# Patient Record
Sex: Female | Born: 1950 | Race: Black or African American | Hispanic: No
Health system: Midwestern US, Community
[De-identification: ages and names within clinical notes are randomized; demographics above are authoritative.]

## PROBLEM LIST (undated history)

## (undated) DIAGNOSIS — R739 Hyperglycemia, unspecified: Secondary | ICD-10-CM

## (undated) DIAGNOSIS — M5430 Sciatica, unspecified side: Secondary | ICD-10-CM

## (undated) DIAGNOSIS — F609 Personality disorder, unspecified: Secondary | ICD-10-CM

## (undated) DIAGNOSIS — G894 Chronic pain syndrome: Secondary | ICD-10-CM

## (undated) DIAGNOSIS — K589 Irritable bowel syndrome without diarrhea: Secondary | ICD-10-CM

## (undated) DIAGNOSIS — F418 Other specified anxiety disorders: Secondary | ICD-10-CM

## (undated) DIAGNOSIS — M109 Gout, unspecified: Secondary | ICD-10-CM

## (undated) DIAGNOSIS — I10 Essential (primary) hypertension: Secondary | ICD-10-CM

## (undated) DIAGNOSIS — E559 Vitamin D deficiency, unspecified: Secondary | ICD-10-CM

## (undated) DIAGNOSIS — G4700 Insomnia, unspecified: Secondary | ICD-10-CM

## (undated) DIAGNOSIS — N2889 Other specified disorders of kidney and ureter: Secondary | ICD-10-CM

## (undated) DIAGNOSIS — K219 Gastro-esophageal reflux disease without esophagitis: Secondary | ICD-10-CM

## (undated) DIAGNOSIS — M1990 Unspecified osteoarthritis, unspecified site: Secondary | ICD-10-CM

## (undated) HISTORY — PX: BREAST LUMPECTOMY: SHX2

## (undated) HISTORY — PX: ABDOMINAL HYSTERECTOMY: SHX81

## (undated) HISTORY — PX: SHOULDER SURGERY: SHX246

---

## 2007-03-02 ENCOUNTER — Ambulatory Visit: Payer: Self-pay | Admitting: Family Medicine

## 2007-03-25 ENCOUNTER — Ambulatory Visit: Payer: Self-pay | Admitting: Family Medicine

## 2011-03-01 ENCOUNTER — Emergency Department (HOSPITAL_COMMUNITY)
Admission: EM | Admit: 2011-03-01 | Discharge: 2011-03-01 | Disposition: A | Discharge status: Home / Self Care | Payer: Medicare Other | Attending: Emergency Medicine | Admitting: Emergency Medicine

## 2011-03-01 DIAGNOSIS — Z862 Personal history of diseases of the blood and blood-forming organs and certain disorders involving the immune mechanism: Secondary | ICD-10-CM | POA: Insufficient documentation

## 2011-03-01 DIAGNOSIS — E876 Hypokalemia: Secondary | ICD-10-CM | POA: Insufficient documentation

## 2011-03-01 DIAGNOSIS — N390 Urinary tract infection, site not specified: Secondary | ICD-10-CM | POA: Insufficient documentation

## 2011-03-01 DIAGNOSIS — Z8639 Personal history of other endocrine, nutritional and metabolic disease: Secondary | ICD-10-CM | POA: Insufficient documentation

## 2011-03-01 DIAGNOSIS — R1031 Right lower quadrant pain: Secondary | ICD-10-CM | POA: Insufficient documentation

## 2011-03-01 DIAGNOSIS — I10 Essential (primary) hypertension: Secondary | ICD-10-CM | POA: Insufficient documentation

## 2011-03-01 DIAGNOSIS — R112 Nausea with vomiting, unspecified: Secondary | ICD-10-CM | POA: Insufficient documentation

## 2011-03-01 DIAGNOSIS — A5901 Trichomonal vulvovaginitis: Secondary | ICD-10-CM | POA: Insufficient documentation

## 2011-03-01 DIAGNOSIS — N898 Other specified noninflammatory disorders of vagina: Secondary | ICD-10-CM | POA: Insufficient documentation

## 2011-03-01 DIAGNOSIS — Z79899 Other long term (current) drug therapy: Secondary | ICD-10-CM | POA: Insufficient documentation

## 2011-03-01 DIAGNOSIS — R197 Diarrhea, unspecified: Secondary | ICD-10-CM | POA: Insufficient documentation

## 2011-03-01 LAB — COMPREHENSIVE METABOLIC PANEL
AST: 15 U/L (ref 0–37)
CO2: 24 mEq/L (ref 19–32)
Calcium: 9.9 mg/dL (ref 8.4–10.5)
Creatinine, Ser: 1.16 mg/dL — ABNORMAL HIGH (ref 0.50–1.10)
GFR calc Af Amer: 58 mL/min — ABNORMAL LOW (ref >90)
GFR calc non Af Amer: 50 mL/min — ABNORMAL LOW (ref >90)
Glucose, Bld: 112 mg/dL — ABNORMAL HIGH (ref 70–99)

## 2011-03-01 LAB — CBC
MCH: 32.4 pg (ref 26.0–34.0)
MCV: 91.7 fL (ref 78.0–100.0)
Platelets: 219 10*3/uL (ref 150–400)
RBC: 4.81 MIL/uL (ref 3.87–5.11)

## 2011-03-01 LAB — URINALYSIS, ROUTINE W REFLEX MICROSCOPIC
Nitrite: NEGATIVE
Protein, ur: 100 mg/dL — AB
Urobilinogen, UA: 0.2 mg/dL (ref 0.0–1.0)

## 2011-03-01 LAB — DIFFERENTIAL
Eosinophils Absolute: 0.1 10*3/uL (ref 0.0–0.7)
Lymphs Abs: 2.4 10*3/uL (ref 0.7–4.0)
Monocytes Absolute: 0.8 10*3/uL (ref 0.1–1.0)
Monocytes Relative: 8 % (ref 3–12)
Neutrophils Relative %: 66 % (ref 43–77)

## 2011-03-01 LAB — URINE MICROSCOPIC-ADD ON

## 2011-03-01 LAB — WET PREP, GENITAL: Yeast Wet Prep HPF POC: NONE SEEN

## 2011-03-04 LAB — URINE CULTURE
Colony Count: >=100000
Culture  Setup Time: 201210220059

## 2014-02-07 ENCOUNTER — Encounter (HOSPITAL_COMMUNITY): Payer: Self-pay | Admitting: Emergency Medicine

## 2014-02-07 ENCOUNTER — Emergency Department (HOSPITAL_COMMUNITY): Payer: Medicare Other

## 2014-02-07 ENCOUNTER — Inpatient Hospital Stay (HOSPITAL_COMMUNITY)
Admission: EM | Admit: 2014-02-07 | Discharge: 2014-02-09 | DRG: 206 | Disposition: A | Discharge status: Home / Self Care | Payer: Medicare Other | Attending: Family Medicine | Admitting: Family Medicine

## 2014-02-07 DIAGNOSIS — R000 Tachycardia, unspecified: Secondary | ICD-10-CM | POA: Diagnosis present

## 2014-02-07 DIAGNOSIS — E785 Hyperlipidemia, unspecified: Secondary | ICD-10-CM | POA: Diagnosis present

## 2014-02-07 DIAGNOSIS — Z803 Family history of malignant neoplasm of breast: Secondary | ICD-10-CM | POA: Diagnosis not present

## 2014-02-07 DIAGNOSIS — F419 Anxiety disorder, unspecified: Secondary | ICD-10-CM | POA: Diagnosis present

## 2014-02-07 DIAGNOSIS — I10 Essential (primary) hypertension: Secondary | ICD-10-CM | POA: Diagnosis present

## 2014-02-07 DIAGNOSIS — G894 Chronic pain syndrome: Secondary | ICD-10-CM | POA: Diagnosis present

## 2014-02-07 DIAGNOSIS — Z86010 Personal history of colonic polyps: Secondary | ICD-10-CM

## 2014-02-07 DIAGNOSIS — K5730 Diverticulosis of large intestine without perforation or abscess without bleeding: Secondary | ICD-10-CM | POA: Diagnosis present

## 2014-02-07 DIAGNOSIS — F17210 Nicotine dependence, cigarettes, uncomplicated: Secondary | ICD-10-CM | POA: Diagnosis present

## 2014-02-07 DIAGNOSIS — K219 Gastro-esophageal reflux disease without esophagitis: Secondary | ICD-10-CM | POA: Diagnosis present

## 2014-02-07 DIAGNOSIS — R0989 Other specified symptoms and signs involving the circulatory and respiratory systems: Secondary | ICD-10-CM | POA: Diagnosis present

## 2014-02-07 DIAGNOSIS — N898 Other specified noninflammatory disorders of vagina: Secondary | ICD-10-CM | POA: Diagnosis not present

## 2014-02-07 DIAGNOSIS — N390 Urinary tract infection, site not specified: Secondary | ICD-10-CM | POA: Diagnosis present

## 2014-02-07 DIAGNOSIS — M109 Gout, unspecified: Secondary | ICD-10-CM | POA: Diagnosis present

## 2014-02-07 DIAGNOSIS — F418 Other specified anxiety disorders: Secondary | ICD-10-CM | POA: Diagnosis present

## 2014-02-07 DIAGNOSIS — Z66 Do not resuscitate: Secondary | ICD-10-CM | POA: Diagnosis present

## 2014-02-07 DIAGNOSIS — D45 Polycythemia vera: Secondary | ICD-10-CM | POA: Diagnosis present

## 2014-02-07 DIAGNOSIS — E872 Acidosis: Secondary | ICD-10-CM | POA: Diagnosis present

## 2014-02-07 DIAGNOSIS — R0902 Hypoxemia: Principal | ICD-10-CM | POA: Diagnosis present

## 2014-02-07 DIAGNOSIS — R748 Abnormal levels of other serum enzymes: Secondary | ICD-10-CM | POA: Diagnosis present

## 2014-02-07 DIAGNOSIS — R102 Pelvic and perineal pain: Secondary | ICD-10-CM

## 2014-02-07 HISTORY — DX: Gastro-esophageal reflux disease without esophagitis: K21.9

## 2014-02-07 HISTORY — DX: Unspecified osteoarthritis, unspecified site: M19.90

## 2014-02-07 HISTORY — DX: Gout, unspecified: M10.9

## 2014-02-07 HISTORY — DX: Chronic pain syndrome: G89.4

## 2014-02-07 HISTORY — DX: Hyperglycemia, unspecified: R73.9

## 2014-02-07 HISTORY — DX: Sciatica, unspecified side: M54.30

## 2014-02-07 HISTORY — DX: Personality disorder, unspecified: F60.9

## 2014-02-07 HISTORY — DX: Essential (primary) hypertension: I10

## 2014-02-07 HISTORY — DX: Other specified anxiety disorders: F41.8

## 2014-02-07 HISTORY — DX: Insomnia, unspecified: G47.00

## 2014-02-07 HISTORY — DX: Vitamin D deficiency, unspecified: E55.9

## 2014-02-07 HISTORY — DX: Irritable bowel syndrome without diarrhea: K58.9

## 2014-02-07 LAB — COMPREHENSIVE METABOLIC PANEL
ALBUMIN: 3.5 g/dL (ref 3.5–5.2)
ALK PHOS: 121 U/L — AB (ref 39–117)
ALT: 8 U/L (ref 0–35)
AST: 15 U/L (ref 0–37)
Anion gap: 14 (ref 5–15)
BILIRUBIN TOTAL: 0.3 mg/dL (ref 0.3–1.2)
BUN: 11 mg/dL (ref 6–23)
CHLORIDE: 106 meq/L (ref 96–112)
CO2: 22 meq/L (ref 19–32)
Calcium: 9.5 mg/dL (ref 8.4–10.5)
Creatinine, Ser: 1.06 mg/dL (ref 0.50–1.10)
GFR calc Af Amer: 63 mL/min — ABNORMAL LOW (ref >90)
GFR calc non Af Amer: 54 mL/min — ABNORMAL LOW (ref >90)
Glucose, Bld: 93 mg/dL (ref 70–99)
POTASSIUM: 3.6 meq/L — AB (ref 3.7–5.3)
Sodium: 142 mEq/L (ref 137–147)
Total Protein: 7.4 g/dL (ref 6.0–8.3)

## 2014-02-07 LAB — URINALYSIS, ROUTINE W REFLEX MICROSCOPIC
Bilirubin Urine: NEGATIVE
GLUCOSE, UA: NEGATIVE mg/dL
HGB URINE DIPSTICK: NEGATIVE
Ketones, ur: NEGATIVE mg/dL
Nitrite: NEGATIVE
PH: 5.5 (ref 5.0–8.0)
SPECIFIC GRAVITY, URINE: 1.019 (ref 1.005–1.030)
Urobilinogen, UA: 1 mg/dL (ref 0.0–1.0)

## 2014-02-07 LAB — CBC WITH DIFFERENTIAL/PLATELET
BASOS PCT: 0 % (ref 0–1)
Basophils Absolute: 0 10*3/uL (ref 0.0–0.1)
EOS PCT: 1 % (ref 0–5)
Eosinophils Absolute: 0.1 10*3/uL (ref 0.0–0.7)
HEMATOCRIT: 49.5 % — AB (ref 36.0–46.0)
HEMOGLOBIN: 16.7 g/dL — AB (ref 12.0–15.0)
LYMPHS PCT: 35 % (ref 12–46)
Lymphs Abs: 2.5 10*3/uL (ref 0.7–4.0)
MCH: 31.6 pg (ref 26.0–34.0)
MCHC: 33.7 g/dL (ref 30.0–36.0)
MCV: 93.6 fL (ref 78.0–100.0)
MONO ABS: 0.7 10*3/uL (ref 0.1–1.0)
Monocytes Relative: 10 % (ref 3–12)
NEUTROS ABS: 3.8 10*3/uL (ref 1.7–7.7)
NEUTROS PCT: 54 % (ref 43–77)
Platelets: 223 10*3/uL (ref 150–400)
RBC: 5.29 MIL/uL — AB (ref 3.87–5.11)
RDW: 13.9 % (ref 11.5–15.5)
WBC: 7.1 10*3/uL (ref 4.0–10.5)

## 2014-02-07 LAB — WET PREP, GENITAL
Clue Cells Wet Prep HPF POC: NONE SEEN
TRICH WET PREP: NONE SEEN
YEAST WET PREP: NONE SEEN

## 2014-02-07 LAB — URINE MICROSCOPIC-ADD ON

## 2014-02-07 LAB — I-STAT VENOUS BLOOD GAS, ED
ACID-BASE DEFICIT: 5 mmol/L — AB (ref 0.0–2.0)
Bicarbonate: 23 mEq/L (ref 20.0–24.0)
O2 SAT: 51 %
Patient temperature: 98.6
TCO2: 25 mmol/L (ref 0–100)
pCO2, Ven: 55 mmHg — ABNORMAL HIGH (ref 45.0–50.0)
pH, Ven: 7.23 — ABNORMAL LOW (ref 7.250–7.300)
pO2, Ven: 33 mmHg (ref 30.0–45.0)

## 2014-02-07 LAB — LIPASE, BLOOD: Lipase: 29 U/L (ref 11–59)

## 2014-02-07 MED ORDER — AZITHROMYCIN 250 MG PO TABS
1000.0000 mg | ORAL_TABLET | Freq: Once | ORAL | Status: AC
  Administered 2014-02-07: 1000 mg via ORAL
  Filled 2014-02-07: qty 4

## 2014-02-07 MED ORDER — OXYCODONE-ACETAMINOPHEN 5-325 MG PO TABS
1.0000 | ORAL_TABLET | ORAL | Status: DC | PRN
  Administered 2014-02-07 – 2014-02-09 (×5): 1 via ORAL
  Filled 2014-02-07 (×5): qty 1

## 2014-02-07 MED ORDER — HEPARIN (PORCINE) IN NACL 100-0.45 UNIT/ML-% IJ SOLN
900.0000 [IU]/h | INTRAMUSCULAR | Status: DC
  Administered 2014-02-08: 900 [IU]/h via INTRAVENOUS
  Filled 2014-02-07: qty 250

## 2014-02-07 MED ORDER — CHOLESTYRAMINE 4 G PO PACK
4.0000 g | PACK | Freq: Three times a day (TID) | ORAL | Status: DC
  Administered 2014-02-09: 4 g via ORAL
  Filled 2014-02-07 (×7): qty 1

## 2014-02-07 MED ORDER — ALLOPURINOL 300 MG PO TABS
300.0000 mg | ORAL_TABLET | Freq: Every day | ORAL | Status: DC
  Administered 2014-02-08: 300 mg via ORAL
  Filled 2014-02-07 (×2): qty 1

## 2014-02-07 MED ORDER — HEPARIN BOLUS VIA INFUSION
3000.0000 [IU] | Freq: Once | INTRAVENOUS | Status: AC
  Administered 2014-02-07: 3000 [IU] via INTRAVENOUS
  Filled 2014-02-07: qty 3000

## 2014-02-07 MED ORDER — IOHEXOL 300 MG/ML  SOLN
80.0000 mL | Freq: Once | INTRAMUSCULAR | Status: AC | PRN
  Administered 2014-02-07: 80 mL via INTRAVENOUS

## 2014-02-07 MED ORDER — ONDANSETRON HCL 4 MG PO TABS: 4.0000 mg | ORAL_TABLET | Freq: Four times a day (QID) | ORAL | Status: DC

## 2014-02-07 MED ORDER — ALBUTEROL SULFATE (2.5 MG/3ML) 0.083% IN NEBU
5.0000 mg | INHALATION_SOLUTION | Freq: Once | RESPIRATORY_TRACT | Status: AC
  Administered 2014-02-07: 5 mg via RESPIRATORY_TRACT
  Filled 2014-02-07: qty 6

## 2014-02-07 MED ORDER — AMLODIPINE BESYLATE 10 MG PO TABS
10.0000 mg | ORAL_TABLET | Freq: Every day | ORAL | Status: DC
  Administered 2014-02-07 – 2014-02-09 (×3): 10 mg via ORAL
  Filled 2014-02-07 (×3): qty 1

## 2014-02-07 MED ORDER — OXYCODONE-ACETAMINOPHEN 7.5-325 MG PO TABS: 1.0000 | ORAL_TABLET | ORAL | Status: DC | PRN

## 2014-02-07 MED ORDER — METHYLPREDNISOLONE SODIUM SUCC 125 MG IJ SOLR
125.0000 mg | Freq: Once | INTRAMUSCULAR | Status: AC
  Administered 2014-02-07: 125 mg via INTRAVENOUS
  Filled 2014-02-07: qty 2

## 2014-02-07 MED ORDER — CLONAZEPAM 1 MG PO TABS
1.0000 mg | ORAL_TABLET | Freq: Every evening | ORAL | Status: DC | PRN
  Administered 2014-02-07 – 2014-02-08 (×2): 1 mg via ORAL
  Filled 2014-02-07 (×2): qty 1

## 2014-02-07 MED ORDER — DIPHENHYDRAMINE HCL 50 MG/ML IJ SOLN
50.0000 mg | Freq: Once | INTRAMUSCULAR | Status: AC
  Administered 2014-02-07: 50 mg via INTRAVENOUS
  Filled 2014-02-07: qty 1

## 2014-02-07 MED ORDER — MORPHINE SULFATE 4 MG/ML IJ SOLN
4.0000 mg | Freq: Once | INTRAMUSCULAR | Status: AC
  Administered 2014-02-07: 4 mg via INTRAVENOUS
  Filled 2014-02-07: qty 1

## 2014-02-07 MED ORDER — GABAPENTIN 300 MG PO CAPS
300.0000 mg | ORAL_CAPSULE | Freq: Three times a day (TID) | ORAL | Status: DC
  Administered 2014-02-07 – 2014-02-09 (×5): 300 mg via ORAL
  Filled 2014-02-07 (×7): qty 1

## 2014-02-07 MED ORDER — DIPHENHYDRAMINE HCL 50 MG/ML IJ SOLN
25.0000 mg | Freq: Once | INTRAMUSCULAR | Status: AC
  Administered 2014-02-07: 25 mg via INTRAVENOUS
  Filled 2014-02-07: qty 1

## 2014-02-07 MED ORDER — DIPHENHYDRAMINE HCL 50 MG/ML IJ SOLN
50.0000 mg | Freq: Once | INTRAMUSCULAR | Status: DC
  Filled 2014-02-07: qty 1

## 2014-02-07 MED ORDER — ALBUTEROL SULFATE (2.5 MG/3ML) 0.083% IN NEBU: 3.0000 mL | INHALATION_SOLUTION | Freq: Four times a day (QID) | RESPIRATORY_TRACT | Status: DC | PRN

## 2014-02-07 MED ORDER — METOPROLOL TARTRATE 50 MG PO TABS
50.0000 mg | ORAL_TABLET | Freq: Two times a day (BID) | ORAL | Status: DC
  Administered 2014-02-07 – 2014-02-09 (×4): 50 mg via ORAL
  Filled 2014-02-07 (×5): qty 1

## 2014-02-07 MED ORDER — AZITHROMYCIN 1 G PO PACK: 1.0000 g | PACK | Freq: Once | ORAL | Status: DC

## 2014-02-07 MED ORDER — SODIUM CHLORIDE 0.9 % IJ SOLN
3.0000 mL | Freq: Two times a day (BID) | INTRAMUSCULAR | Status: DC
  Administered 2014-02-07 – 2014-02-08 (×2): 3 mL via INTRAVENOUS

## 2014-02-07 MED ORDER — ONDANSETRON HCL 4 MG/2ML IJ SOLN
4.0000 mg | Freq: Once | INTRAMUSCULAR | Status: AC
  Administered 2014-02-07: 4 mg via INTRAVENOUS
  Filled 2014-02-07: qty 2

## 2014-02-07 MED ORDER — DEXTROSE 5 % IV SOLN
1.0000 g | Freq: Once | INTRAVENOUS | Status: AC
  Administered 2014-02-07: 1 g via INTRAVENOUS
  Filled 2014-02-07: qty 10

## 2014-02-07 MED ORDER — TOPIRAMATE 100 MG PO TABS
100.0000 mg | ORAL_TABLET | Freq: Every day | ORAL | Status: DC
  Administered 2014-02-07 – 2014-02-08 (×2): 100 mg via ORAL
  Filled 2014-02-07 (×3): qty 1

## 2014-02-07 MED ORDER — SIMVASTATIN 10 MG PO TABS: 10.0000 mg | ORAL_TABLET | Freq: Every day | ORAL | Status: DC

## 2014-02-07 MED ORDER — PRAVASTATIN SODIUM 20 MG PO TABS
20.0000 mg | ORAL_TABLET | Freq: Every day | ORAL | Status: DC
  Administered 2014-02-08 – 2014-02-09 (×2): 20 mg via ORAL
  Filled 2014-02-07 (×4): qty 1

## 2014-02-07 MED ORDER — POLYETHYLENE GLYCOL 3350 17 G PO PACK
17.0000 g | PACK | Freq: Every day | ORAL | Status: DC
  Filled 2014-02-07 (×2): qty 1

## 2014-02-07 MED ORDER — PANTOPRAZOLE SODIUM 40 MG PO TBEC
80.0000 mg | DELAYED_RELEASE_TABLET | Freq: Every day | ORAL | Status: DC
  Administered 2014-02-08: 80 mg via ORAL
  Filled 2014-02-07: qty 2

## 2014-02-07 MED ORDER — DIPHENHYDRAMINE HCL 50 MG/ML IJ SOLN
25.0000 mg | Freq: Once | INTRAMUSCULAR | Status: AC
  Administered 2014-02-07: 25 mg via INTRAVENOUS

## 2014-02-07 MED ORDER — DULOXETINE HCL 60 MG PO CPEP
60.0000 mg | ORAL_CAPSULE | Freq: Every day | ORAL | Status: DC
  Administered 2014-02-08 – 2014-02-09 (×2): 60 mg via ORAL
  Filled 2014-02-07 (×2): qty 1

## 2014-02-07 MED ORDER — CIPROFLOXACIN HCL 500 MG PO TABS: 500.0000 mg | ORAL_TABLET | Freq: Two times a day (BID) | ORAL | Status: DC

## 2014-02-07 MED ORDER — HYDRALAZINE HCL 20 MG/ML IJ SOLN: 5.0000 mg | INTRAMUSCULAR | Status: DC | PRN

## 2014-02-07 MED ORDER — SODIUM CHLORIDE 0.9 % IV BOLUS (SEPSIS)
1000.0000 mL | Freq: Once | INTRAVENOUS | Status: AC
  Administered 2014-02-07: 1000 mL via INTRAVENOUS

## 2014-02-07 NOTE — ED Notes (Signed)
This RN went into pt's room to introduce herself, when pt stated that her right arm was itching, and the veins were red. This RN observed phlebitis and redness of the veins in the pt's right arm. Regenia Skeeter, MD is aware, orders to follow.

## 2014-02-07 NOTE — ED Notes (Signed)
Pt monitored by pulse oximetry while ambulating. Pt denies dizziness or lightheadedness during ambulation. Pt's o2 saturation stayed around 85-86% during ambulation but dropped to 83% twice. Pt's heart rate stayed around 110bpm during ambulation. Pt returned to bed and is monitored by pulse ox, bp cuff, and 5-lead.

## 2014-02-07 NOTE — ED Notes (Addendum)
Pt remains on average at 88% SpO2 when on RA.  92% on 2L.  Increased to 3L nasal cannula to 97%.  Pt not on home O2.  Notified [place].

## 2014-02-07 NOTE — Progress Notes (Signed)
ANTICOAGULATION CONSULT NOTE - Initial Consult  Pharmacy Consult for heparin Indication: r/o PE  Allergies  Allergen Reactions  . Phenobarbital Anaphylaxis  . Ace Inhibitors Swelling  . Bactrim [Sulfamethoxazole-Tmp Ds] Nausea Only  . Ibuprofen Nausea Only  . Ivp Dye [Iodinated Diagnostic Agents] Hives  . Lamictal [Lamotrigine] Other (See Comments)    Throat closing  . Penicillins Hives    Patient Measurements: Height: 4\' 9"  (144.8 cm) Weight: 148 lb 14.4 oz (67.541 kg) IBW/kg (Calculated) : 38.6 Heparin Dosing Weight: 55kg  Vital Signs: Temp: 98.4 F (36.9 C) (09/30 2250) Temp src: Oral (09/30 2250) BP: 158/85 mmHg (09/30 2250) Pulse Rate: 111 (09/30 2250)  Labs:  Recent Labs  02/07/14 1420 02/07/14 1451  HGB 16.7*  --   HCT 49.5*  --   PLT 223  --   CREATININE  --  1.06    Estimated Creatinine Clearance: 42.5 ml/min (by C-G formula based on Cr of 1.06).   Medical History: Past Medical History  Diagnosis Date  . Gout   . Hypertension   . Arthritis   . Insomnia   . Vitamin D deficiency   . GERD (gastroesophageal reflux disease)   . IBS (irritable bowel syndrome)   . Chronic pain syndrome   . Personality disorder   . Depression with anxiety   . Hyperglycemia   . Sciatica     Medications:  Prescriptions prior to admission  Medication Sig Dispense Refill  . albuterol (PROVENTIL HFA;VENTOLIN HFA) 108 (90 BASE) MCG/ACT inhaler Inhale into the lungs every 6 (six) hours as needed for wheezing or shortness of breath.      . allopurinol (ZYLOPRIM) 300 MG tablet Take 300 mg by mouth daily.      Marland Kitchen amLODipine (NORVASC) 5 MG tablet Take 5 mg by mouth daily.      . butalbital-acetaminophen-caffeine (FIORICET WITH CODEINE) 50-325-40-30 MG per capsule Take 1 capsule by mouth every 4 (four) hours as needed for headache.      . cholestyramine (QUESTRAN) 4 G packet Take 4 g by mouth 3 (three) times daily with meals.      . clonazePAM (KLONOPIN) 1 MG tablet Take 1 mg  by mouth at bedtime as needed for anxiety.      . DULoxetine (CYMBALTA) 60 MG capsule Take 60 mg by mouth daily.      . ergocalciferol (VITAMIN D2) 50000 UNITS capsule Take 50,000 Units by mouth once a week. Monday      . esomeprazole (NEXIUM) 40 MG capsule Take 40 mg by mouth daily at 12 noon.      . gabapentin (NEURONTIN) 300 MG capsule Take 300 mg by mouth 3 (three) times daily.      . hydrOXYzine (ATARAX/VISTARIL) 25 MG tablet Take 25 mg by mouth 3 (three) times daily as needed.      . meclizine (ANTIVERT) 25 MG tablet Take 25 mg by mouth 3 (three) times daily as needed for dizziness.      . metoprolol succinate (TOPROL-XL) 50 MG 24 hr tablet Take 50 mg by mouth daily. Take with or immediately following a meal.      . oxyCODONE-acetaminophen (PERCOCET) 7.5-325 MG per tablet Take 1 tablet by mouth every 4 (four) hours as needed for pain.      . polyethylene glycol (MIRALAX / GLYCOLAX) packet Take 17 g by mouth daily.      . pravastatin (PRAVACHOL) 20 MG tablet Take 20 mg by mouth daily.      Marland Kitchen topiramate (  TOPAMAX) 25 MG tablet Take 100 mg by mouth daily.       Scheduled:  . [START ON 02/08/2014] allopurinol  300 mg Oral Daily  . amLODipine  10 mg Oral Daily  . [START ON 02/08/2014] cholestyramine  4 g Oral TID WC  . [START ON 02/08/2014] DULoxetine  60 mg Oral Daily  . gabapentin  300 mg Oral TID  . metoprolol tartrate  50 mg Oral BID  . [START ON 02/08/2014] pantoprazole  80 mg Oral Q1200  . [START ON 02/08/2014] polyethylene glycol  17 g Oral Daily  . [START ON 02/08/2014] pravastatin  20 mg Oral Daily  . sodium chloride  3 mL Intravenous Q12H  . topiramate  100 mg Oral QHS    Assessment: 63yo female c/o abnormal vaginal discharge, no acute findings but upon discharge pt was noted to be hypoxic, no c/o SOB/CP from pt, further exam reveals O2 to low 80s and HR up to 110s on ambulation, concern for PE, awaiting VQ scan since had CT of pelvis earlier w/ pruritic rash w/ contrast, to begin  heparin.  Goal of Therapy:  Heparin level 0.3-0.7 units/ml Monitor platelets by anticoagulation protocol: Yes   Plan:  Will give heparin 3000 units x1 followed by gtt at 900 units/hr and monitor heparin levels and CBC.  Wynona Neat, PharmD, BCPS 02/07/2014,11:02 PM

## 2014-02-07 NOTE — ED Notes (Signed)
Pt continues to sat 86% on room air after her breathing treatment. Pt placed back on 3L of O2, and is satting at 96%.

## 2014-02-07 NOTE — ED Notes (Signed)
Pt had a liquid stool. There was no solid stool, light brown in color. Pt states that this has been going on x months.

## 2014-02-07 NOTE — ED Provider Notes (Signed)
3:53 PM Pt signed out by Alvina Chou, PA-C at shift change.  Plan is to f/u on CT abd, concern for intraabdominal/pelvic infectious process due to c/o >33mo h/o vaginal discharge, initially bloody and is now "pus." Pt also c/o bilateral lower abdominal pain.  Nauseated relieved with phenergan.  Labs: CBC, CMP, and Lipase-unremarkable. Wet prep: significant for WBC-TMTC, UA: moderate leukocytes, and proteinuria.   3:56 PM CT abdomen is pending.  If unremarkable, pt may be discharged home with pain and nausea medication.    6:59 PM CT abd: no acute findings within abdomen or pelvis.  Discussed with pt.  Also reviewed labs and imaging with Dr. Regenia Skeeter. Due to on acute findings on CT to suggest cause of pt's pain, plan was to discharge pt home with nausea medication and tx for possible UTI, urine culture sent.  Rx: Cipro.  As pt was being discharged, nursing staff informed this PA pt is hypoxic while not on O2, O2 dropping into 80s while on RA.  Pt does not normally require O2 at home.  Pt denies chest pain or difficulty breathing. Faint expiratory wheeze in left upper lobe on exam.  Discussed with Dr. Regenia Skeeter, will give albuterol tx and get CXR.  Will get ambulatory O2 after albuterol tx and CXR.    CXR: no evidence of acute cardiopulmonary disease. Lungs: CTAB after albuterol tx.   21:24: Pt ambulated around ED with pulse Ox dropping to 83% twice and HR elevated to 110 while ambulating.  Discussed pt with Dr. Regenia Skeeter, will get ABG and consult hospitalist to admit pt for further evaluation of hypoxia.  Pt will likely need V/Q scan for possible PE as pt had CT abd/pelvis with contrast earlier today with pre-mediation and still developed a small pruritic rash.    9:54 PM Consulted with family practice, pt will be admitted under the care of Dr. Nori Riis to a telemetry bed.    Noland Fordyce, PA-C 02/07/14 2224

## 2014-02-07 NOTE — ED Notes (Signed)
Pt presents with > 1 month h/o vaginal discharge - pt reports discharge was initially bloody and is now "pus"  Pt denies any vaginal pain, reports bilateral lower abdominal pain. Pt denies any dysuria, reports urine is darker, no foul odor.  +nausea - relieved with phenergan.

## 2014-02-07 NOTE — Progress Notes (Signed)
Pt admitted to the unit. Pt is alert and oriented. Pt oriented to room, staff, and call bell. Educated on fall safety plan. Bed in lowest position. Full assessment to Epic. Call bell with in reach. Educated to call for assists. Will continue to monitor. Venus Ruhe, RN 

## 2014-02-07 NOTE — ED Provider Notes (Signed)
CSN: [id number]     Arrival date & time 02/07/14  1016 History   First MD Initiated Contact with Patient 02/07/14 1245     No chief complaint on file.    (Consider location/radiation/quality/duration/timing/severity/associated sxs/prior Treatment) HPI Comments: The patient is a 63 year old female who presents today to ED for vaginal discharge and bilateral pelvic pain that has been going on for the past few weeks. Discharge started as blood tinged and now is brownish and yellowish. States that she has been soiling her underwear lately and thinks that it could possibly be coming from her vagina. History of partial hysterectomy with both ovaries still present with a strong family history of breast cancer(in mother and both sisters). Accompanied symptoms include nausea, fever and chills for the past few days. Denies dysuria, gross hematuria, flank pain or history of kidney stones. Notes she has not been sexually after in years.    Past Medical History  Diagnosis Date  . Gout   . Hypertension   . Arthritis   . Insomnia   . Vitamin D deficiency   . GERD (gastroesophageal reflux disease)   . IBS (irritable bowel syndrome)   . Chronic pain syndrome   . Personality disorder   . Depression with anxiety   . Hyperglycemia   . Sciatica    Past Surgical History  Procedure Laterality Date  . Shoulder surgery    . Breast lumpectomy    . Abdominal hysterectomy     History reviewed. No pertinent family history. History  Substance Use Topics  . Smoking status: Current Every Day Smoker -- 0.50 packs/day  . Smokeless tobacco: Not on file  . Alcohol Use: No   OB History   Grav Para Term Preterm Abortions TAB SAB Ect Mult Living                 Review of Systems  Constitutional: Positive for fever. Negative for chills and fatigue.  HENT: Negative for trouble swallowing.   Eyes: Negative for visual disturbance.  Respiratory: Negative for shortness of breath.   Cardiovascular: Negative for  chest pain and palpitations.  Gastrointestinal: Positive for nausea and abdominal pain. Negative for vomiting and diarrhea.  Genitourinary: Positive for vaginal discharge. Negative for dysuria and difficulty urinating.  Musculoskeletal: Negative for arthralgias and neck pain.  Skin: Negative for color change.  Neurological: Negative for dizziness and weakness.  Psychiatric/Behavioral: Negative for dysphoric mood.      Allergies  Phenobarbital; Ace inhibitors; Bactrim; Ibuprofen; Ivp dye; Lamictal; and Penicillins  Home Medications   Prior to Admission medications   Not on File   BP 133/91  Pulse 97  Temp(Src) 99.4 F (37.4 C) (Oral)  Resp 14  Ht 4\' 9"  (1.448 m)  Wt 148 lb 14.4 oz (67.541 kg)  BMI 32.21 kg/m2  SpO2 95% Physical Exam  Nursing note and vitals reviewed. Constitutional: She appears well-developed and well-nourished. No distress.  HENT:  Head: Normocephalic and atraumatic.  Eyes: Conjunctivae and EOM are normal.  Neck: Normal range of motion.  Cardiovascular: Normal rate and regular rhythm.  Exam reveals no gallop and no friction rub.   No murmur heard. Pulmonary/Chest: Effort normal and breath sounds normal. She has no wheezes. She has no rales. She exhibits no tenderness.  Abdominal: Soft. She exhibits no distension. There is tenderness. There is no rebound and no guarding.  Lower abdominal and RUQ tenderness to palpation. No other focal tenderness to palpation.   Genitourinary: Vagina normal.  Diffuse tenderness  upon pelvic exam accompanied with bilateral ovarian tenderness. Brown and yellowish discharge present. No rashes or lesions present.   Musculoskeletal: Normal range of motion.  Neurological: She is alert.  Speech is goal-oriented. Moves limbs without ataxia.   Skin: Skin is warm and dry.    ED Course  Procedures (including critical care time) Labs Review Labs Reviewed  URINALYSIS, ROUTINE W REFLEX MICROSCOPIC - Abnormal; Notable for the  following:    APPearance CLOUDY (*)    Protein, ur >300 (*)    Leukocytes, UA MODERATE (*)    All other components within normal limits  URINE MICROSCOPIC-ADD ON - Abnormal; Notable for the following:    Squamous Epithelial / LPF FEW (*)    Bacteria, UA FEW (*)    All other components within normal limits  WET PREP, GENITAL  GC/CHLAMYDIA PROBE AMP  CBC WITH DIFFERENTIAL  COMPREHENSIVE METABOLIC PANEL  LIPASE, BLOOD    Imaging Review Dg Chest 2 View  02/07/2014   CLINICAL DATA:  Wheezing  EXAM: CHEST  2 VIEW  COMPARISON:  None.  FINDINGS: Chronic interstitial markings. No focal consolidation. No pleural effusion or pneumothorax.  The heart is normal in size.  Mild degenerative changes of the visualized thoracolumbar spine.  IMPRESSION: No evidence of acute cardiopulmonary disease.   Electronically Signed   By: Julian Hy M.D.   On: 02/07/2014 20:24   Ct Abdomen Pelvis W Contrast  02/07/2014   CLINICAL DATA:  Uncontrollable bowel movements.  EXAM: CT ABDOMEN AND PELVIS WITH CONTRAST  TECHNIQUE: Multidetector CT imaging of the abdomen and pelvis was performed using the standard protocol following bolus administration of intravenous contrast.  CONTRAST:  67mL OMNIPAQUE IOHEXOL 300 MG/ML  SOLN  COMPARISON:  None.  FINDINGS: Lower chest: Right middle lobe pulmonary measures 4 mm, image number 1/ series 205. Also in the right middle lobe is a 3 mm nodule, image number 3/ series 205. No pleural effusions identified.  Hepatobiliary: There is no focal liver abnormality. Previous cholecystectomy. The common bile duct measures up to 7 mm. No stone or mass identified.  Pancreas: Normal appearance of the pancreas. No pancreatic duct dilatation.  Spleen: Appears normal.  Adrenals/Urinary Tract: Normal appearance of the adrenal glands. Hyperdense lesion within the upper pole of the right kidney measures 2.2 cm, image 20/series 201. 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 Pelvocaliectasis is noted within the right kidney. The urinary bladder appears normal could  Stomach/Bowel: The stomach appears normal. The small bowel loops have a normal course and caliber. There is no bowel obstruction. Normal appearance of the proximal colon. Multiple distal colonic diverticula identified without acute inflammation.  Vascular/Lymphatic: Calcified atherosclerotic disease involves the abdominal aorta. No aneurysm. No retroperitoneal adenopathy. No mesenteric adenopathy. No pelvic or inguinal adenopathy.  Reproductive: Previous hysterectomy.  No adnexal mass identified.  Other: Periumbilical hernia is identified which contains fat only.  Musculoskeletal: Review of the visualized bony structures is unremarkable.  IMPRESSION: 1. No acute findings within the abdomen or pelvis. 2. Indeterminate hyperdense lesion is identified within the upper pole the left kidney. This may represent a hyperdense cyst or a solid enhancing lesion. Recommend further evaluation with contrast enhanced renal protocol MRI or CT of the kidneys. 3. Atherosclerotic disease. 4. Sigmoid diverticulosis without acute inflammation. 5. Small, nonspecific right middle lobe pulmonary nodules. If the patient is at high risk for bronchogenic carcinoma, follow-up chest CT at 1 year is recommended. If the patient is  at low risk, no follow-up is needed. This recommendation follows the consensus statement: Guidelines for Management of Small Pulmonary Nodules Detected on CT Scans: A Statement from the [employer] as published in Radiology 2005; 237:395-400. 6. Right kidney AML.   Electronically Signed   By: Kerby Moors M.D.   On: 02/07/2014 18:13   Nm Pulmonary Perf And Vent  02/08/2014   CLINICAL DATA:  Hypoxia.  EXAM: NUCLEAR MEDICINE VENTILATION - PERFUSION LUNG SCAN  TECHNIQUE: Ventilation images were obtained in multiple projections using inhaled aerosol technetium 99 M DTPA. Perfusion images were obtained in  multiple projections after intravenous injection of Tc-78m MAA.  RADIOPHARMACEUTICALS:  Forty mCi Tc-52m DTPA aerosol and 6 mCi Tc-32m MAA  COMPARISON:  02/07/2014  FINDINGS: Ventilation: Slight hypoventilation in the lateral basal segment right lower lobe.  Perfusion: No wedge shaped peripheral perfusion defects to suggest acute pulmonary embolism.  IMPRESSION: 1. No perfusion defect to suggest pulmonary embolus. 2. Slight hypoventilation in the lateral basal segment right lower lobe. Cause uncertain ; there is no significant radiographic abnormality in this area.   Electronically Signed   By: Sherryl Barters M.D.   On: 02/08/2014 11:02     EKG Interpretation None      MDM   Final diagnoses:  Vaginal discharge  Pelvic pain in female  UTI (lower urinary tract infection)    1:55 PM Patient's urinalysis appears to be contaminated. Patient will have CT abdomen pelvis with pre-medication due to previous hives reaction.   Patient signed out to Noland Fordyce, [place], PA-C 02/09/14 0020

## 2014-02-07 NOTE — H&P (Signed)
[facility] Admission History and Physical Service Pager: [phone]  Patient name: Tracy Calhoun Medical record number: [id number] Date of birth: 05-17-1950 Age: 63 y.o. Gender: female  Primary Care Provider: No PCP Per Patient Consultants: None Code Status: DNR  Chief Complaint: Vaginal Discharge with Pelvic Pain; Incidental Hypoxia  Assessment and Plan: Tracy Calhoun is a 63 y.o. female presenting with one month history of vaginal discharge with pelvic pain bilaterally.  Was noted to by hypoxic down to 80s while in ED. PMH is significant for HTN, depression with anxiety, and chronic pain.  #Hypoxia- DDx includes PE (hypoxic with tachycardia, bilateral calf pain) vs. Lung damage secondary to smoking (Smoking since age 60, quit three years ago and restarted one year ago) vs. Malignancy.  Denies personal history of malignancy. Denies shortness of breath or any other symptoms with episodes of hypoxia.  Denies recent immobility. Wells Score of 7.5. Will start heparin now given leading diagnosis but potentially pt hypoxic intermittently throughout the day without knowing in the past 2/2 lung disease. Will tailor treatment accordingly  - Lowest documented 81% on room air in ED - VBG: pH 7.23, pCO2 55, pO2 33, Bicarb 23  -Indicates Respiratory Acidosis - CXR- no acute cardiopulmonary disease  - V/Q scan tomorrow - F/U lower extremity dopplers - EKG pending - Trending troponins - Heparin per pharmacy consult - Albuterol nebulizer q6hr PRN - NPO pending possible invasive intervention tomorrow  #Tachycardia appears to be in NSR- will obtain EKG this morning to confirm  - HR 86-115 -will increase dose of home BB for rate control (home toprolol xl 50 vs tartrate 50 BID) - CMP within normal limits except for minimally low potassium at 3.6 and elevated alkaline phosphatase at 121  - Follow up CMP in am  #HTN- BP range 133-167/ 80-99 - Continued home dose of  Amlodipine 10mg  and Metoprolol 50mg  BID - Hydralazine 5mg  PRN SBP >180, DBP >110  #Vaginal discharge with bilateral pelvic pain - CBC shows normal WBC at 7.1 - Wet Prep showed no signs of Yeast, Trichomonas, and Clue Cells. WBC too numerous to count. - UA cloudy with moderate leukocytes, few bacteria, and proteinuria >300 - Urine Culture pending. - CT Abdomen/Pelvis with Contrast:  No acute findings within the abdomen or pelvis.   - Indeterminate hyperdense lesion in left kidney concerning for hyperdense cyst or solid enhancing lesion.   - Recommend further evaluation with contrast enhanced renal protocol MRI or CT of the kidneys.  - Small, nonspecific right middle lobe pulmonary nodules.    - High risk for bronchogenic carcinoma, follow-up chest CT at 1 year is recommended  - Atherosclerotic disease.   - Sigmoid diverticulosis without acute inflammation.   - Small, nonspecific right middle lobe pulmonary nodules.   - Follow-up chest CT at 1 year is recommended.  - Right kidney AML - Azithromycin given in ED - Rocephin given in ED - Pain treated with Percocet q4hr PRN -will follow up on urine culture no other clear source at this time  #Polycythemia - CBC shows elevated hemoglobin at 16.7 - Follow up CBC in am  #Carotid bruit- noted on exam over Left Carotid - F/U Doppler of the Carotids   #Psych-hx of depression, anxiety; pt anxious and clammy with decision, may be playing a role in tachycardia but not hypoxia - Continue home medications:  Klonopin 1mg , Cymbalta 60mg   #H/O Gout- continued home dose of allopurinol  #H/O Hyperlipidemia - Continued home medications:  Cholestyramine and Pravastatin  Social: Consult to Care Management concerning long term acute care FEN/GI: NPO, Saline Lock, Protonix, Miralax Prophylaxis: Heparin per Pharmacy Consult  Disposition: Admitted by [facility] Medicine Teaching Service to Telemetry with Dr. Nori Riis attending.   History of Present Illness:  Tracy Calhoun is a 63 y.o. female with history of HTN, hypoerlipidemia, depression, and anxiety presenting with bilateral pelvic pain and vaginal discharge for the last few weeks.  She describes the discharge as blood tinged, but it has recently become yellow/brown. Notes that she saw a doctor in [place] recently for similar complaints and was noted to have an abnormality in urine, but this was not treated and she was not told what the abnormality was. Also complains of nausea, fever, chills, and headaches. Denies shortness of breath and chest pain, even during periods of hypoxia. Does not have a history of exertional dyspnea; states the only thing that limits her ambulation is knee pain. She notes bilaterally calf pain for the past several months. She has been smoking since the age of 75; she quit three years ago but states that she resumed smoking one year ago. She denies history of lung disease.   In the ED, UA showed with proteinuria and leukocytes. CT ab/pelvis obtained without acute abdominal or pelvic findings, but did note small, nonspecific right middle lobe pulmonary nodules.. CBC, CMET, and lipase were within normal limits. Wet prep showed WBC too numerous to count. Initial plan was to d/c home with prescription for ciprofloxacin, however nursing staff noted Mrs. Giangrande was hypoxic on RA during discahrge. CXR was obtained and showed no acute process. Mrs. Archbold was then ambulated with pulse ox to low 80s accompanied by HR of 110s. ABG ordered and pt admitted for further w/up of hypoxia.   Does have family hx of breast cancer and lung dx. Pt does have hx of nose bleeds and heavy periods. No hx of blood clots.   Review Of Systems: 12 point review of systems was performed and was unremarkable.  Patient Active Problem List   Diagnosis Date Noted  . Hypoxia 02/07/2014   Past Medical History: Past Medical History  Diagnosis Date  . Gout   . Hypertension   . Arthritis   . Insomnia   .  Vitamin D deficiency   . GERD (gastroesophageal reflux disease)   . IBS (irritable bowel syndrome)   . Chronic pain syndrome   . Personality disorder   . Depression with anxiety   . Hyperglycemia   . Sciatica    Past Surgical History: Past Surgical History  Procedure Laterality Date  . Shoulder surgery    . Breast lumpectomy    . Abdominal hysterectomy     Social History: History  Substance Use Topics  . Smoking status: Current Every Day Smoker -- 0.50 packs/day  . Smokeless tobacco: Not on file  . Alcohol Use: No   Additional social history: multiple failed smoking cessation Please also refer to relevant sections of EMR.  Family History: History reviewed. No pertinent family history. Allergies and Medications: Allergies  Allergen Reactions  . Phenobarbital Anaphylaxis  . Ace Inhibitors Swelling  . Bactrim [Sulfamethoxazole-Tmp Ds] Nausea Only  . Ibuprofen Nausea Only  . Ivp Dye [Iodinated Diagnostic Agents] Hives  . Lamictal [Lamotrigine] Other (See Comments)    Throat closing  . Penicillins Hives   No current facility-administered medications on file prior to encounter.   No current outpatient prescriptions on file prior to encounter.    Objective: BP  158/85  Pulse 111  Temp(Src) 98.4 F (36.9 C) (Oral)  Resp 22  Ht 4\' 9"  (1.448 m)  Wt 148 lb 14.4 oz (67.541 kg)  BMI 32.21 kg/m2  SpO2 94% Exam: General: 63yo female resting comfortably.  Appears anxious throughout exam. HEENT: Carotid bruit noted on left Cardiovascular: 1/6 Systolic Murmur noted with radiation to right carotid, regular rhythm, tachycardic Respiratory: Clear to auscultation bilaterally. No wheezing noted. No increased work of breathing. Abdomen: Bowel sounds noted. Soft and non-distended. No tenderness or masses to palpation. Extremities: Pain with palpation of calves bilaterally. Negative Homan's sign bilaterally. Pulses noted in all four extremities. No edema noted. Skin: No rashes  noted. Warm. Well perfused. Neuro: No focal deficits noted. Alert and Oriented.  Labs and Imaging: CBC BMET   Recent Labs Lab 02/07/14 1420  WBC 7.1  HGB 16.7*  HCT 49.5*  PLT 223    Recent Labs Lab 02/07/14 1451  NA 142  K 3.6*  CL 106  CO2 22  BUN 11  CREATININE 1.06  GLUCOSE 93  CALCIUM 9.5    Urinalysis    Component Value Date/Time   COLORURINE YELLOW 02/07/2014 1044   APPEARANCEUR CLOUDY* 02/07/2014 1044   LABSPEC 1.019 02/07/2014 1044   PHURINE 5.5 02/07/2014 1044   GLUCOSEU NEGATIVE 02/07/2014 1044   HGBUR NEGATIVE 02/07/2014 1044   BILIRUBINUR NEGATIVE 02/07/2014 1044   [place] 02/07/2014 1044   PROTEINUR >300* 02/07/2014 1044   UROBILINOGEN 1.0 02/07/2014 1044   NITRITE NEGATIVE 02/07/2014 1044   LEUKOCYTESUR MODERATE* 02/07/2014 1044  VBG: pH 7.23, pCO2 55, pO2 33, Bicarb 23  Dg Chest 2 View  02/07/2014   CLINICAL DATA:  Wheezing  EXAM: CHEST  2 VIEW  COMPARISON:  None.  FINDINGS: Chronic interstitial markings. No focal consolidation. No pleural effusion or pneumothorax.  The heart is normal in size.  Mild degenerative changes of the visualized thoracolumbar spine.  IMPRESSION: No evidence of acute cardiopulmonary disease.   Electronically Signed   By: Julian Hy M.D.   On: 02/07/2014 20:24   Ct Abdomen Pelvis W Contrast  02/07/2014   CLINICAL DATA:  Uncontrollable bowel movements.  EXAM: CT ABDOMEN AND PELVIS WITH CONTRAST  TECHNIQUE: Multidetector CT imaging of the abdomen and pelvis was performed using the standard protocol following bolus administration of intravenous contrast.  CONTRAST:  57mL OMNIPAQUE IOHEXOL 300 MG/ML  SOLN  COMPARISON:  None.  FINDINGS: Lower chest: Right middle lobe pulmonary measures 4 mm, image number 1/ series 205. Also in the right middle lobe is a 3 mm nodule, image number 3/ series 205. No pleural effusions identified.  Hepatobiliary: There is no focal liver abnormality. Previous cholecystectomy. The common bile duct  measures up to 7 mm. No stone or mass identified.  Pancreas: Normal appearance of the pancreas. No pancreatic duct dilatation.  Spleen: Appears normal.  Adrenals/Urinary Tract: Normal appearance of the adrenal glands. Hyperdense lesion within the upper pole of the right kidney measures 2.2 cm, image 20/series 201. 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 Pelvocaliectasis is noted within the right kidney. The urinary bladder appears normal could  Stomach/Bowel: The stomach appears normal. The small bowel loops have a normal course and caliber. There is no bowel obstruction. Normal appearance of the proximal colon. Multiple distal colonic diverticula identified without acute inflammation.  Vascular/Lymphatic: Calcified atherosclerotic disease involves the abdominal aorta. No aneurysm. No retroperitoneal adenopathy. No mesenteric adenopathy. No pelvic or inguinal adenopathy.  Reproductive:  Previous hysterectomy.  No adnexal mass identified.  Other: Periumbilical hernia is identified which contains fat only.  Musculoskeletal: Review of the visualized bony structures is unremarkable.  IMPRESSION: 1. No acute findings within the abdomen or pelvis. 2. Indeterminate hyperdense lesion is identified within the upper pole the left kidney. This may represent a hyperdense cyst or a solid enhancing lesion. Recommend further evaluation with contrast enhanced renal protocol MRI or CT of the kidneys. 3. Atherosclerotic disease. 4. Sigmoid diverticulosis without acute inflammation. 5. Small, nonspecific right middle lobe pulmonary nodules. If the patient is at high risk for bronchogenic carcinoma, follow-up chest CT at 1 year is recommended. If the patient is at low risk, no follow-up is needed. This recommendation follows the consensus statement: Guidelines for Management of Small Pulmonary Nodules Detected on CT Scans: A Statement from the [employer] as published in Radiology  2005; 237:395-400. 6. Right kidney AML.   Electronically Signed   By: Kerby Moors M.D.   On: 02/07/2014 18:13    Lorna Few, DO 02/07/2014, 11:58 PM PGY-1, [place] Intern pager: [phone], text pages welcome   Agree with excellent note by PGY-1 [facility]. The above reflects are joint decision making. Any changes are seen in BLUE Bernadene Bell, MD Family Medicine PGY-2 Please page or call with questions

## 2014-02-08 ENCOUNTER — Inpatient Hospital Stay (HOSPITAL_COMMUNITY): Payer: Medicare Other

## 2014-02-08 DIAGNOSIS — I10 Essential (primary) hypertension: Secondary | ICD-10-CM | POA: Diagnosis present

## 2014-02-08 DIAGNOSIS — R102 Pelvic and perineal pain: Secondary | ICD-10-CM

## 2014-02-08 DIAGNOSIS — N898 Other specified noninflammatory disorders of vagina: Secondary | ICD-10-CM

## 2014-02-08 DIAGNOSIS — R0902 Hypoxemia: Secondary | ICD-10-CM

## 2014-02-08 DIAGNOSIS — R0989 Other specified symptoms and signs involving the circulatory and respiratory systems: Secondary | ICD-10-CM

## 2014-02-08 LAB — COMPREHENSIVE METABOLIC PANEL
ALT: 8 U/L (ref 0–35)
AST: 11 U/L (ref 0–37)
Albumin: 3.2 g/dL — ABNORMAL LOW (ref 3.5–5.2)
Alkaline Phosphatase: 102 U/L (ref 39–117)
Anion gap: 14 (ref 5–15)
BUN: 17 mg/dL (ref 6–23)
CALCIUM: 9.1 mg/dL (ref 8.4–10.5)
CO2: 23 meq/L (ref 19–32)
Chloride: 108 mEq/L (ref 96–112)
Creatinine, Ser: 1.54 mg/dL — ABNORMAL HIGH (ref 0.50–1.10)
GFR calc non Af Amer: 35 mL/min — ABNORMAL LOW (ref >90)
GFR, EST AFRICAN AMERICAN: 40 mL/min — AB (ref >90)
Glucose, Bld: 103 mg/dL — ABNORMAL HIGH (ref 70–99)
Potassium: 3.8 mEq/L (ref 3.7–5.3)
SODIUM: 145 meq/L (ref 137–147)
TOTAL PROTEIN: 6.5 g/dL (ref 6.0–8.3)
Total Bilirubin: <0.2 mg/dL — ABNORMAL LOW (ref 0.3–1.2)

## 2014-02-08 LAB — URINE CULTURE: Colony Count: 9000

## 2014-02-08 LAB — GC/CHLAMYDIA PROBE AMP
CT PROBE, AMP APTIMA: NEGATIVE
GC Probe RNA: NEGATIVE

## 2014-02-08 LAB — TROPONIN I: Troponin I: <0.3 ng/mL (ref <0.30)

## 2014-02-08 MED ORDER — TECHNETIUM TO 99M ALBUMIN AGGREGATED
6.0000 | Freq: Once | INTRAVENOUS | Status: AC | PRN
  Administered 2014-02-08: 6 via INTRAVENOUS

## 2014-02-08 MED ORDER — TECHNETIUM TC 99M DIETHYLENETRIAME-PENTAACETIC ACID: 40.0000 | Freq: Once | INTRAVENOUS | Status: AC | PRN

## 2014-02-08 MED ORDER — INFLUENZA VAC SPLIT QUAD 0.5 ML IM SUSY
0.5000 mL | PREFILLED_SYRINGE | INTRAMUSCULAR | Status: AC
  Administered 2014-02-09: 0.5 mL via INTRAMUSCULAR
  Filled 2014-02-08: qty 0.5

## 2014-02-08 NOTE — Progress Notes (Signed)
Utilization review completed.  

## 2014-02-08 NOTE — Progress Notes (Signed)
Patient ID: Tracy Calhoun, female   DOB: Jun 23, 1950, 63 y.o.   MRN: [id number] Family Medicine Teaching Service Daily Progress Note Intern Pager: [phone]  Patient name: [name] Medical record number: [id number] Date of birth: 27-Oct-1950 Age: 63 y.o. Gender: female  Primary Care Provider: No PCP Per Patient Consultants: None Code Status: DNR  Pt Overview and Major Events to Date:  9/30- Admitted to floor for incidental hypoxia 10/1- Hypoxia resolved now on RA  Assessment and Plan: Tracy Calhoun is a 63 y.o. female presenting with one month history of vaginal discharge with pelvic pain bilaterally. Was noted to by hypoxic down to 80s while in ED. PMH is significant for HTN, depression with anxiety, and chronic pain.   #Hypoxia- DDx includes PE (hypoxic with tachycardia, bilateral calf pain) vs. Lung damage secondary to smoking (Smoking since age 36, quit three years ago and restarted one year ago) vs. Malignancy. Denies personal history of malignancy. Denies shortness of breath or any other symptoms with episodes of hypoxia. Denies recent immobility. Wells Score of 7.5. Potentially pt hypoxic intermittently throughout the day without knowing in the past 2/2 lung disease. Improved. - Lowest documented 81% on room air in ED  - VBG: pH 7.23, pCO2 55, pO2 33, Bicarb 23 -->Indicates Respiratory Acidosis  - CXR- no acute cardiopulmonary disease  - V/Q scan ordered - F/U lower extremity dopplers  - EKG pending  - Trending troponins  - Heparin per pharmacy consult - treatment dose started - Albuterol nebulizer q6hr PRN  - NPO pending possible invasive intervention tomorrow   #Tachycardia appears to be in NSR- will obtain EKG this morning to confirm; likely due to increased anxiety - HR 86-115  -increased BB for rate control (home toprolol xl 50 vs tartrate 50 BID)  - CMP within normal limits except for minimally low potassium at 3.6 and elevated alkaline phosphatase at 121    #HTN- BP 114/93  - Continued home dose of Amlodipine 10mg  and Metoprolol 50mg  BID  - Hydralazine 5mg  PRN SBP >180, DBP >110   #Vaginal discharge with bilateral pelvic pain - CBC shows normal WBC at 7.1  - Wet Prep showed no signs of Yeast, Trichomonas, and Clue Cells. WBC too numerous to count.  - UA cloudy with moderate leukocytes, few bacteria, and proteinuria >300  - Urine Culture pending.  - CT Abdomen/Pelvis with Contrast: No acute findings within the abdomen or pelvis.  - Indeterminate hyperdense lesion in left kidney concerning for hyperdense cyst or solid enhancing lesion. (Recommend further evaluation with contrast enhanced renal protocol MRI or CT of the kidneys). -Also some pulmonary nodules found. - Azithromycin and Rocephin given in ED  - Pain treated with Percocet q4hr PRN   #Polycythemia  - CBC shows elevated hemoglobin at 16.7  - patient has denied further blood sticks and no CBC was obtained this AM; will discuss and try again  #Carotid bruit- noted on exam over Left Carotid  - F/U Doppler of the Carotids   #Psych-hx of depression, anxiety; may have played a role in tachycardia but not hypoxia  - Continue home medications: Klonopin 1mg , Cymbalta 60mg   #H/O Gout- continued home dose of allopurinol   #H/O Hyperlipidemia  - Continued home medications: Cholestyramine and Pravastatin   Social: CSW for PCP FEN/GI: NPO, Saline Lock, Protonix, Miralax  Prophylaxis: Heparin per Pharmacy Consult   Disposition: Continue current management as above; dispo pending imaging. Possible d/c today if all negative.  Subjective:  Patient appears anxious  this morning. She does not want any more lab draws and denied the one this morning. She states just put in a catheter if we are going to continue to need blood. She also states that she does not want an MRI. Otherwise she has no complaints. She denies SOB and chest pain.  Patient denies any more vaginal discharge or pelvic pain  this morning.  Objective: Temp:  [98.2 F (36.8 C)-99.4 F (37.4 C)] 98.2 F (36.8 C) (10/01 0435) Pulse Rate:  [86-115] 89 (10/01 0435) Resp:  [14-24] 22 (10/01 0435) BP: (114-167)/(80-99) 114/93 mmHg (10/01 0435) SpO2:  [81 %-99 %] 97 % (10/01 0435) Weight:  [148 lb 14.4 oz (67.541 kg)-152 lb (68.947 kg)] 148 lb 14.4 oz (67.541 kg) (09/30 2250) Physical Exam: General: Appears anxious throughout exam. Alert, NAD, cooperative. Sitting up in bed. HEENT: NCAT. Carotid bruit noted on left. Dry MM. Vision grossly intact. Cardiovascular: 2/6 Systolic Murmur noted with radiation to right carotid, regular rhythm, tachycardic  Respiratory: Clear to auscultation bilaterally. No wheezing noted. No increased work of breathing.  Abdomen: Bowel sounds noted. Soft and non-distended. No tenderness or masses to palpation.  Extremities: No cyanosis, clubbing, edema. All pulses full and equal bilaterally. Skin: Intact without suspicious lesions or rashes. Warm and dry. Neuro: No focal deficits noted. Alert and Oriented. Psych: Mood and affect are normal; anxious.  Laboratory:  Recent Labs Lab 02/07/14 1420  WBC 7.1  HGB 16.7*  HCT 49.5*  PLT 223    Recent Labs Lab 02/07/14 1451  NA 142  K 3.6*  CL 106  CO2 22  BUN 11  CREATININE 1.06  CALCIUM 9.5  PROT 7.4  BILITOT 0.3  ALKPHOS 121*  ALT 8  AST 15  GLUCOSE 93   Urinalysis    Component Value Date/Time   COLORURINE YELLOW 02/07/2014 1044   APPEARANCEUR CLOUDY* 02/07/2014 1044   LABSPEC 1.019 02/07/2014 1044   PHURINE 5.5 02/07/2014 1044   GLUCOSEU NEGATIVE 02/07/2014 1044   HGBUR NEGATIVE 02/07/2014 1044   BILIRUBINUR NEGATIVE 02/07/2014 1044   KETONESUR NEGATIVE 02/07/2014 1044   PROTEINUR >300* 02/07/2014 1044   UROBILINOGEN 1.0 02/07/2014 1044   NITRITE NEGATIVE 02/07/2014 1044   LEUKOCYTESUR MODERATE* 02/07/2014 1044   Trop x1 neg VBG: pH 7.23, pCO2 55, pO2 33, Bicarb 23  Imaging/Diagnostic Tests: Dg Chest 2 View 02/07/2014    IMPRESSION: No evidence of acute cardiopulmonary disease.   Ct Abdomen Pelvis W Contrast 02/07/2014  IMPRESSION: 1. No acute findings within the abdomen or pelvis. 2. Indeterminate hyperdense lesion is identified within the upper pole the left kidney. This may represent a hyperdense cyst or a solid enhancing lesion. Recommend further evaluation with contrast enhanced renal protocol MRI or CT of the kidneys. 3. Atherosclerotic disease. 4. Sigmoid diverticulosis without acute inflammation. 5. Small, nonspecific right middle lobe pulmonary nodules. If the patient is at high risk for bronchogenic carcinoma, follow-up chest CT at 1 year is recommended. If the patient is at low risk, no follow-up is needed. This recommendation follows the consensus statement: Guidelines for Management of Small Pulmonary Nodules Detected on CT Scans: A Statement from the [place] as published in Radiology 2005; 237:395-400. 6. Right kidney AML.      Katheren Shams, DO 02/08/2014, 6:46 AM PGY-1, [place] Intern pager: [phone], text pages welcome

## 2014-02-08 NOTE — Progress Notes (Signed)
Pt NPO for VQ SCAN to rule out PE

## 2014-02-08 NOTE — Progress Notes (Signed)
Discussed in rounds  Agree with the documentation and management of Dr. Gerarda Fraction.  Seen by Dr. Nori Riis attending today.

## 2014-02-08 NOTE — Progress Notes (Signed)
*  PRELIMINARY RESULTS* Vascular Ultrasound Carotid Duplex (Doppler) has been completed.   Findings suggest 1-39% internal carotid artery stenosis bilaterally. Vertebral arteries are patent with antegrade flow.  *Preliminary Results* Bilateral lower extremity venous duplex completed. Bilateral lower extremities are negative for deep vein thrombosis. There is no evidence of Baker's cyst bilaterally.  02/08/2014 3:27 PM Maudry Mayhew, RVT, RDCS, RDMS

## 2014-02-08 NOTE — Discharge Summary (Signed)
[facility] Discharge Summary  Patient name: Tracy Calhoun Medical record number: [id number] Date of birth: 14-Dec-1950 Age: 63 y.o. Gender: female Date of Admission: 02/07/2014  Date of Discharge: 02/09/2014 Admitting Physician: Dickie La, MD  Primary Care Provider: No PCP Per Patient Consultants: None  Indication for Hospitalization: Hypoxia  Discharge Diagnoses/Problem List:  Patient Active Problem List   Diagnosis Date Noted  . Essential hypertension 02/08/2014  . Hypoxia 02/07/2014   Disposition: Discharge to Home  Discharge Condition: Stable  Discharge Exam:  General: Alert, NAD, cooperative. Lying in bed. HEENT: NCAT. MMM. Vision grossly intact.  Cardiovascular: 2/6 Systolic Murmur, RRR Respiratory: CTAB. No wheezing noted. No increased work of breathing.  Abdomen: Bowel sounds noted. Soft and non-distended. No tenderness or masses to palpation.  Extremities: No cyanosis, clubbing, edema.  Skin: Intact without suspicious lesions or rashes. Warm and dry.  Neuro: No focal deficits noted. Alert and Oriented.   Brief Hospital Course:  Tracy Calhoun is a 63 y.o. female who presented with one month history of vaginal discharge with pelvic pain bilaterally. Was noted to by hypoxic down to 80s while in ED and thus was admitted. PMH is significant for HTN, depression with anxiety, and chronic pain.   For management of patient, please see below problem-based course:   Hypoxia- In the ED, patient was found to be hypoxic to 81% on RA. Patient did not have any associated SOB, or chest pain. VBG showed pH 7.23, pCO2 55, pO2 33, Bicarb 23 indicating Respiratory Acidosis. DDx included PE due to hypoxia with tachycardia and bilateral calf pain vs. Lung damage secondary to smoking  vs. Malignancy. She was started on 3L of oxygen and given albuterol nebulizer prn. Heparin treatment dose was also initiated due to concerns for PE. V/Q scan, LE dopplers, and  carotid US were ordered and all normal/unremarkable.Heparin was discontinued. CXR had no acute cardiopulmonary disease.  Patient's hypoxia eventually improved and she was weaned off oxygen. Patient was stating well on RA(SpO2 96%).   Tachycardia on admission- EKG showed NSR; likely occured due to increased anxiety. BB was increased for rate control (home toprolol xl 50 vs tartrate 50 BID).   Vaginal discharge with bilateral pelvic pain - Wet prep was unremarkable just showed numerous WBCs. CBC shows normal WBC count of 7.1. UA cloudy with moderate leukocytes, few bacteria, and proteinuria >300. In the ED, patient was given Azithromycin and Rocephin this was discontinued. Urine culture has insignificant growth and no organisms. CT Abdomen/Pelvis with Contrast had no acute findings. Pain treated with Percocet PRN.  HTN- Pressures were stable. Continued home dose of Amlodipine 10mg  and Metoprolol 50mg  BID  Polycythemia - CBC showed elevated hemoglobin at 16.7. Patient refused further blood sticks and no CBC was obtained to follow-up. Prior Hbg in 2012 was 15.6.  Psych-hx of depression, anxiety; Continued home medications: Klonopin and Cymbalta H/O Gout- Continued home dose of allopurinol  H/O Hyperlipidemia - Continued Cholestyramine and Pravastatin   Issues for Follow Up:  -Needs work-up of vaginal discharge; may need GYN referral -Further work-up of polycythemia -Needs further evaluation of renal lesion per radiology. Contrast enhanced renal protocol MRI or CT of the kidneys.  -Also follow-up on lung nodule discovered on CT. -PFTs for COPD work-up; smoking cessation should be encouraged. -Patient to establish with a PCP. Resources given.  Significant Procedures: None  Significant Labs and Imaging:   Recent Labs Lab 02/07/14 1420  WBC 7.1  HGB 16.7*  HCT 49.5*  PLT 223    Recent Labs Lab 02/07/14 1451 02/08/14 1945  NA 142 145  K 3.6* 3.8  CL 106 108  CO2 22 23  GLUCOSE 93 103*   BUN 11 17  CREATININE 1.06 1.54*  CALCIUM 9.5 9.1  ALKPHOS 121* 102  AST 15 11  ALT 8 8  ALBUMIN 3.5 3.2*   Urinalysis    Component Value Date/Time   COLORURINE YELLOW 02/07/2014 1044   APPEARANCEUR CLOUDY* 02/07/2014 1044   LABSPEC 1.019 02/07/2014 1044   PHURINE 5.5 02/07/2014 1044   GLUCOSEU NEGATIVE 02/07/2014 1044   HGBUR NEGATIVE 02/07/2014 1044   BILIRUBINUR NEGATIVE 02/07/2014 1044   [place] 02/07/2014 1044   PROTEINUR >300* 02/07/2014 1044   UROBILINOGEN 1.0 02/07/2014 1044   NITRITE NEGATIVE 02/07/2014 1044   LEUKOCYTESUR MODERATE* 02/07/2014 1044   Wet prep unremarkable except numerous WBCs.  Results/Tests Pending at Time of Discharge: None  Discharge Medications:    Medication List         albuterol 108 (90 BASE) MCG/ACT inhaler  Commonly known as:  PROVENTIL HFA;VENTOLIN HFA  Inhale into the lungs every 6 (six) hours as needed for wheezing or shortness of breath.     allopurinol 300 MG tablet  Commonly known as:  ZYLOPRIM  Take 300 mg by mouth daily.     amLODipine 5 MG tablet  Commonly known as:  NORVASC  Take 5 mg by mouth daily.     butalbital-acetaminophen-caffeine 50-325-40-30 MG per capsule  Commonly known as:  FIORICET WITH CODEINE  Take 1 capsule by mouth every 4 (four) hours as needed for headache.     cholestyramine 4 G packet  Commonly known as:  QUESTRAN  Take 4 g by mouth 3 (three) times daily with meals.     clonazePAM 1 MG tablet  Commonly known as:  KLONOPIN  Take 1 mg by mouth at bedtime as needed for anxiety.     DULoxetine 60 MG capsule  Commonly known as:  CYMBALTA  Take 60 mg by mouth daily.     ergocalciferol 50000 UNITS capsule  Commonly known as:  VITAMIN D2  Take 50,000 Units by mouth once a week. Monday     esomeprazole 40 MG capsule  Commonly known as:  NEXIUM  Take 40 mg by mouth daily at 12 noon.     gabapentin 300 MG capsule  Commonly known as:  NEURONTIN  Take 300 mg by mouth 3 (three) times daily.      hydrOXYzine 25 MG tablet  Commonly known as:  ATARAX/VISTARIL  Take 25 mg by mouth 3 (three) times daily as needed.     meclizine 25 MG tablet  Commonly known as:  ANTIVERT  Take 25 mg by mouth 3 (three) times daily as needed for dizziness.     metoprolol succinate 50 MG 24 hr tablet  Commonly known as:  TOPROL-XL  Take 50 mg by mouth daily. Take with or immediately following a meal.     ondansetron 4 MG tablet  Commonly known as:  ZOFRAN  Take 1 tablet (4 mg total) by mouth every 6 (six) hours.     oxyCODONE-acetaminophen 7.5-325 MG per tablet  Commonly known as:  PERCOCET  Take 1 tablet by mouth every 4 (four) hours as needed for pain.     oxyCODONE-acetaminophen 5-325 MG per tablet  Commonly known as:  PERCOCET/ROXICET  Take 1 tablet by mouth every 6 (six) hours as needed for moderate pain.  polyethylene glycol packet  Commonly known as:  MIRALAX / GLYCOLAX  Take 17 g by mouth daily.     pravastatin 20 MG tablet  Commonly known as:  PRAVACHOL  Take 20 mg by mouth daily.     topiramate 25 MG tablet  Commonly known as:  TOPAMAX  Take 100 mg by mouth daily.        Discharge Instructions: Please refer to Patient Instructions section of EMR for full details.  Patient was counseled important signs and symptoms that should prompt return to medical care, changes in medications, dietary instructions, activity restrictions, and follow up appointments.   Follow-Up Appointments: Follow-up Information   Follow up with [facility]    . (Call to schedule appointment to establish care with a primary care provider for ongoing healthcare needs)    Contact information:   [place] [id number] [phone]      Follow up with [facility]. (Call to schedule appointment to follow up with an OB/GYN (women's doctor) for recheck of vaginal discharge and pelvic pain if not improving in 3-4 days. )    Contact  information:   [place] [address] [phone]      Follow up with [facility]. (Return to ER If symptoms worsen)    Specialty:  Emergency Medicine   Contact information:   [address] [id number] [place] [address] [phone]      Please follow up. [facility] [phone]  Call this number to get names and phone numbers of local doctors accepting new patient.)       Katheren Shams, DO 02/09/2014, 1:54 PM PGY-1, [place]

## 2014-02-08 NOTE — H&P (Signed)
Call Pager [phone] for any questions or notifications regarding this patient  FMTS Attending Admission Note: Tracy Mcmurray MD Attending pager:[phone] I  have seen and examined this patient, reviewed their chart. I have discussed this patient with the resident. I agree with the resident's findings, assessment and care plan. The area she points to for her "pelvic pain" seems to be actually lower abdominal colon area. She says she has had it for several months and that her previous GI doctor told her if it did not resolve to consider repeat colonoscopy a little earlier than her scheduled date due in December. She has since moved to this area and he has retired. She reports hx of colon polyps and thinks she had last colonoscopy 3-5 years agio (has records at home). She ultimatlely needs to be set up with a new PCP and a new GI doctor in town.

## 2014-02-08 NOTE — Progress Notes (Signed)
Pt has refused lab draws all day multiple times.  Paged Family Medicine, Fyi.

## 2014-02-08 NOTE — Care Management Note (Addendum)
CARE MANAGEMENT NOTE 02/08/2014  Patient:  Tracy Calhoun, Tracy Calhoun   Account Number:  [id number]  Date Initiated:  02/08/2014  Documentation initiated by:  Avanni Turnbaugh  Subjective/Objective Assessment:   02/08/2014 CM following for progression and d/c planning.     Action/Plan:   02/08/2014 Noted referral for LTAC, this pt is not an appropriate candidate for LTAC, no acute care needs identified, MD notified.   Anticipated DC Date:     Anticipated DC Plan:           Choice offered to / List presented to:             Status of service:   Medicare Important Message given?   (If response is "NO", the following Medicare IM given date fields will be blank) Date Medicare IM given:   Medicare IM given by:   Date Additional Medicare IM given:   Additional Medicare IM given by:    Discharge Disposition:    Per UR Regulation:    If discussed at Long Length of Stay Meetings, dates discussed:    Comments:  02/27/2014 number for Health Connect , which can provide names and numbers of local MDs accepting new patients placed in d/c instructions.   Jasmine Pang RN MPH, case manager, [phone]   02/08/2014 Spoke with MD, who is aware that pt does not qualify for LTAC or any other placement at this time. This CM spoke with pt re home situation. She currently lives with her son and they have begun the search for her to find a place to live independently. They have applied for public housing etc.  Neither CM nor CSW are able to assist pt in finding housing. This pt is indep of ADLs, able to do some housework and able to shop for short periods of time. Also spoke with MD re followup plans, he states that she will followup at family medicine. CM is unable to setup PCP for any pt and this pt has insurance so she or her family ( she has son and daughter in [place]) can call and arrange an appointment. Jasmine Pang RN MPH, case manager, [phone]

## 2014-02-09 DIAGNOSIS — R0902 Hypoxemia: Secondary | ICD-10-CM | POA: Diagnosis not present

## 2014-02-09 MED ORDER — OXYCODONE-ACETAMINOPHEN 5-325 MG PO TABS: 1.0000 | ORAL_TABLET | Freq: Four times a day (QID) | ORAL | Status: AC | PRN

## 2014-02-09 MED ORDER — ONDANSETRON HCL 4 MG PO TABS: 4.0000 mg | ORAL_TABLET | Freq: Four times a day (QID) | ORAL | Status: AC

## 2014-02-09 NOTE — Discharge Instructions (Signed)
Discharge Date: 02/09/2014  Reason for Hospitalization: Hypoxia -We kept you in the hospital because your oxygen levels were low. -We did not find a cause for this and they have since returned to normal  -Please establish with a doctor for follow-up care. Below are some resources.    Thank you for letting us participate in your care!   Emergency Department Resource Guide 1) Find a Doctor and Pay Out of Pocket Although you won't have to find out who is covered by your insurance plan, it is a good idea to ask around and get recommendations. You will then need to call the office and see if the doctor you have chosen will accept you as a new patient and what types of options they offer for patients who are self-pay. Some doctors offer discounts or will set up payment plans for their patients who do not have insurance, but you will need to ask so you aren't surprised when you get to your appointment.  2) Contact Your Local Health Department Not all health departments have doctors that can see patients for sick visits, but many do, so it is worth a call to see if yours does. If you don't know where your local health department is, you can check in your phone book. The CDC also has a tool to help you locate your state's health department, and many state websites also have listings of all of their local health departments.  3) Find a [facility] If your illness is not likely to be very severe or complicated, you may want to try a walk in clinic. These are popping up all over the country in pharmacies, drugstores, and shopping centers. They're usually staffed by nurse practitioners or physician assistants that have been trained to treat common illnesses and complaints. They're usually fairly quick and inexpensive. However, if you have serious medical issues or chronic medical problems, these are probably not your best option.  No Primary Care Doctor: - Call Health Connect at  [phone] - they can help  you locate a primary care doctor that  accepts your insurance, provides certain services, etc. - Physician Referral Service- [phone]  Chronic Pain Problems: Organization         Address  Phone   Notes  [facility]  [phone] Patients need to be referred by their primary care doctor.   Medication Assistance: Organization         Address  Phone   Notes  [facility] Medication [facility] [place], [place] [address] [phone] --Must be a resident of [facility] -- Must have NO insurance coverage whatsoever (no Medicaid/ Medicare, etc.) -- The pt. MUST have a primary care doctor that directs their care regularly and follows them in the community   MedAssist  [phone]   [employer]  [phone]    Agencies that provide inexpensive medical care: Organization         Address  Phone   Notes  [place]  [phone]   [facility]    [phone]   [facility] [place],  [phone]   [place] [address], [place] [phone]   Planned Parenthood    [phone]   [facility]    [phone]   [place] [facility] [place] [place], [place] Phone:  [phone])  [phone], Fax:  [phone] Hours of Operation:  9 am - 6 pm, M-F.  Also accepts Medicaid/Medicare and self-pay.  [facility] for [place], [address], [place] Phone: [phone], Fax: [phone]. Hours of Operation:  8:30 am - 5:30 pm, M-F.  Also accepts Medicaid and self-pay.  [facility] [place] [address], [place] Phone: [phone]   [place], [place], [place] [phone], Ext. [phone] Mondays & Thursdays: 7-9 AM.  First 15 patients are seen on a first come, first serve basis.    [place] Providers:  Organization         Address  Phone   Notes  [facility] [address], [place] [phone] Also accepts self-pay patients.  [facility] [address], [place]  [phone]   [place], [address], [place] [phone]   [facility] [address] [place] [phone]   Lucianne Lei [address], [place]   [phone] Only accepts [place] Access [place] patients after they have their name applied to their card.   Self-Pay (no insurance) in [facility]:  Organization         Address  Phone   Notes  Sickle Cell Patients, [facility] [place] [phone]   [facility] [place] [phone]   [facility] [place]  [place], [place],  [phone]   [facility]/Dr. Osei-Bonsu  [address] [place] or [address], [place] [phone] Phone number for both [place] and [place] locations is the same.  Urgent Medical and [facility] [address], [place] [phone]   [facility] [address], [place] or [address] [phone]   [facility] [address], [place] [phone], phone; [phone], fax Sees patients 1st and 3rd Saturday of every month.  Must not qualify for public or private insurance (i.e. Medicaid, Medicare, [place] Health Choice, Veterans' Benefits)  Household income should be no more than 200% of the poverty level The clinic cannot treat you if you are pregnant or think you are pregnant  Sexually transmitted diseases are not treated at the clinic.    Dental Care: Organization         Address  Phone  Notes  [facility] of [facility] [place] [phone] Accepts children up to age 44 who are enrolled in [place] or [place]; pregnant women with a Medicaid card; and children who have applied for Medicaid or [place] Health Choice, but were declined, whose parents can pay a reduced fee at time of service.  [facility]  [address], [place] [phone] Accepts children up to age 40 who are enrolled in [place] or [place]; pregnant women with a Medicaid card; and children who have applied for Medicaid or [place] Health Choice, but were declined, whose parents can pay a reduced fee at time of service.  [facility] Adult Dental Access PROGRAM  [place] [phone] Patients are seen by appointment only. Walk-ins are  not accepted. [place] will see patients 23 years of age and older. Monday - Tuesday (8am-5pm) Most Wednesdays (8:30-5pm) $30 per visit, cash only  [facility] Adult Dental Access PROGRAM  [address], [facility] [phone] Patients are seen by appointment only. Walk-ins are not accepted. [place] will see patients 35 years of age and older. One Wednesday Evening (Monthly: Volunteer Based).  $30 per visit, cash only  [place]  [phone] for adults; Children under age 76, call Graduate Pediatric Dentistry at [phone]. Children aged 78-14, please call [phone] to request a pediatric application.  Dental services are provided in all areas of dental care including fillings, crowns and bridges, complete and partial dentures, implants, gum treatment, root canals, and extractions. Preventive care is also provided. Treatment is provided to both adults and children. Patients are selected via a lottery and there is often a waiting list.   [facility] [address], [place]  [phone] [email or web address]   [facility] [address] [place], [place] [phone], Ext. [phone] Second and Fourth Thursday of each month, opens at 6:30 AM; Clinic ends at 9 AM.  Patients are seen on a first-come first-served basis, and a limited number are seen during each clinic.   [facility]  [address] [place] Danker [place], [place] [phone]   Eligibility Requirements You must have lived in [place], [place], or [place] counties for at least the last three months.   You cannot be eligible for state or federal sponsored [employer], including [employer], [place], or [employer].   You generally cannot be eligible for healthcare insurance through your employer.    How to apply: Eligibility screenings are held every Tuesday and Wednesday afternoon from 1:00 pm until 4:00 pm. You do not need an appointment for the interview!  [facility] [address], [place], [place]   [place]  [place] Department  [place]  [phone]    Behavioral Health Resources in the Community: Intensive Outpatient Programs Organization         Address  Phone  Notes  [place]. [address], [place], [place] [phone]   [facility] Outpatient [address], [place], [place]   ADS: Alcohol & Drug Svcs [address], [place], [place]   [place] [address],  [place], [place] or [phone]   Substance Abuse Resources Organization         Address  Phone  Notes  Alcohol and Drug Services  [phone]   [place]  3181836316   [facility] [place]   Chinita Pester  [phone]   Residential & Outpatient Substance Abuse Program  [phone]   Psychological Services Organization         Address  Phone  Notes  [facility]  [place]  [phone]   [place] [address] [place] [phone] or [phone]    Mobile Crisis Teams Organization         Address  Phone  Notes  [facility]rapeutic Alternatives, Mobile Crisis Care Unit  [phone]   [facility]ive Psychotherapeutic Services  [address] [place], [place]   [facility] [address] [place] [phone]    Self-Help/Support  Groups Organization         Address  Phone             Notes  Mental Health Assoc. of [place] - variety of support groups  [place] Call for more information  Narcotics Anonymous (NA), [facility] [address], [employer] [place]  2 meetings at this location   Special educational needs teacher         Address  Phone  Notes  ASAP Residential Treatment [place],    [place]  [phone]   [facility]  [address] [place] [address], [place], [place]   [place], [place] [phone] Admissions: 8am-3pm M-F  Incentives Substance [place] [address],    [place], [place] [phone]   [facility] [address] [place], [place], [place]   [facility] [address],  [place], [place]   [facility] Programs - Intensive Outpatient [address] Kristeen [address], [place], [place]   [facility] [place].,  [place], [place] [phone] or [phone]   [facility]idential Treatment Services (RTS) [address] [place], [place] Accepts Medicaid  [facility] [place] [address],  [place] [phone] Substance Abuse/Addiction Treatment   [facility] Organization         Address  Phone  Notes  [facility]  [phone]   Domenic Schwab, PhD [address] [place]lis [place], [place]   [phone] or  [phone]   [place], [place] [phone]   [facility] [address], [place], [place] [phone] Insurance/Medicaid/sponsorship through [facility] and Families [address], [address] [place]                                    [place], [place] [phone] [place] [address], [place] [phone]    Dr. Adele Schilder  [phone]   [facility] [place] Dept. 1) [address] [place] 2) [place] 3)  [address], [place] [phone]  [phone]   [place] [phone] or [phone] (After Hours)

## 2014-02-09 NOTE — Progress Notes (Signed)
Patient discharge teaching given, including activity, diet, follow-up appoints, and medications. Patient verbalized understanding of all discharge instructions. IV access was d/c'd. Vitals are stable. Skin is intact except as charted in most recent assessments. Pt to be escorted out by NT, to be driven home by family.  Jazzman Loughmiller, MBA, BS, RN 

## 2014-02-09 NOTE — ED Provider Notes (Signed)
Medical screening examination/treatment/procedure(s) were performed by non-physician practitioner and as supervising physician I was immediately available for consultation/collaboration.   EKG Interpretation None        Ephraim Hamburger, MD 02/09/14 [phone]

## 2014-02-09 NOTE — Discharge Summary (Signed)
Seen and examined.  Agree with the documentation and management of Dr. Gerarda Fraction.

## 2014-02-09 NOTE — ED Provider Notes (Signed)
Medical screening examination/treatment/procedure(s) were conducted as a shared visit with non-physician practitioner(s) and myself.  I personally evaluated the patient during the encounter.   EKG Interpretation None     Tracy Calhoun is a 63 y.o. female here with vaginal discharge and diarrhea. Whitish vaginal discharge for several weeks. Denies any stool from vagina. Has diarrhea for several weeks as well and some abdominal pain. Felt nauseated, no fevers. On exam, overweight. Mild diffuse tenderness, worse in LLQ. Vaginal exam showed whitish discharge. No obvious rectal vaginal fistula on rectal vaginal exam. Will do CT ab/pel to further assess. Had to be premedicated for CT. Signed out to oncoming team.    Wandra Arthurs, MD 02/09/14 [phone]

## 2014-03-22 ENCOUNTER — Ambulatory Visit: Payer: Medicare Other | Admitting: Family Medicine

## 2014-04-10 ENCOUNTER — Ambulatory Visit: Payer: Medicare Other | Admitting: Family Medicine

## 2016-03-13 IMAGING — CT CT ABD-PELV W/ CM
2 of 5 series · 12 of 46 positions shown, 14 images · IV contrast (omnipaque)
Comparison: None.

CLINICAL DATA: Uncontrollable bowel movements.

EXAM:
CT ABDOMEN AND PELVIS WITH CONTRAST
TECHNIQUE: Multidetector CT imaging of the abdomen and pelvis was performed
using the standard protocol following bolus administration of
intravenous contrast.
CONTRAST:  80mL OMNIPAQUE IOHEXOL 300 MG/ML  SOLN

[Series 201: routine, idose (2) · axial · 0.71mm/px · z∈[+443,+783]mm · 9 of 86 slices shown, 11 images]
[im 9/86  soft-tissue]
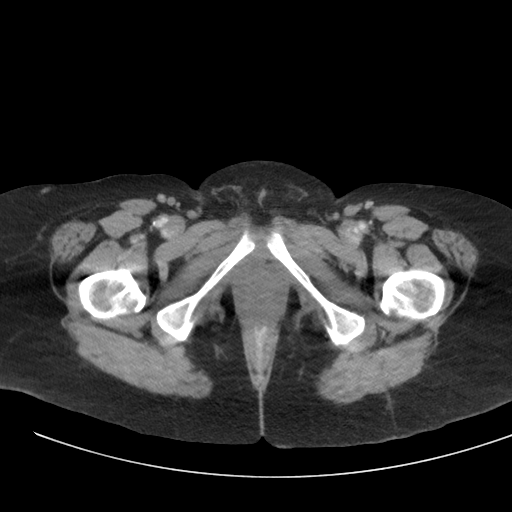
[im 9/86  bone]
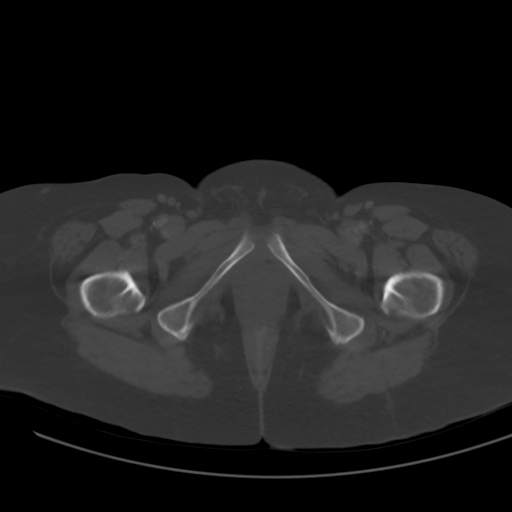
[im 18/86  soft-tissue]
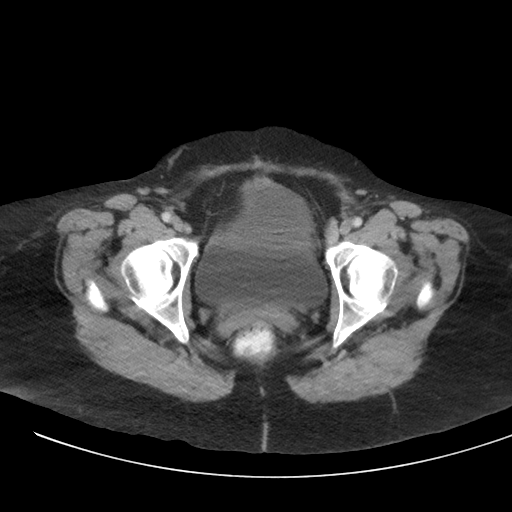
[im 26/86  soft-tissue]
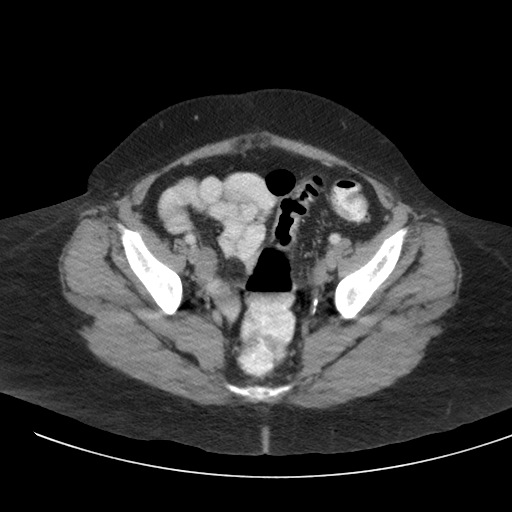
[im 35/86  soft-tissue]
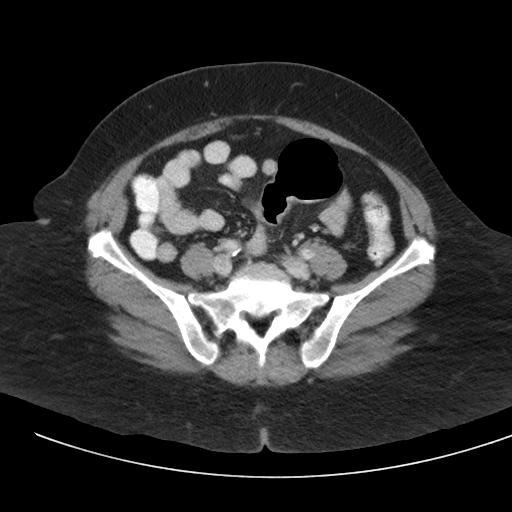
[im 43/86  soft-tissue]
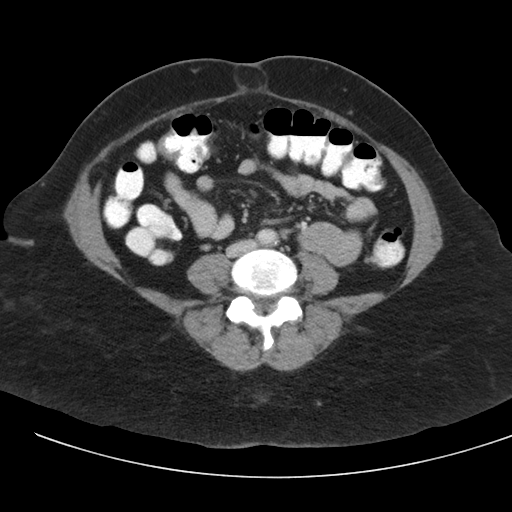
[im 52/86  soft-tissue]
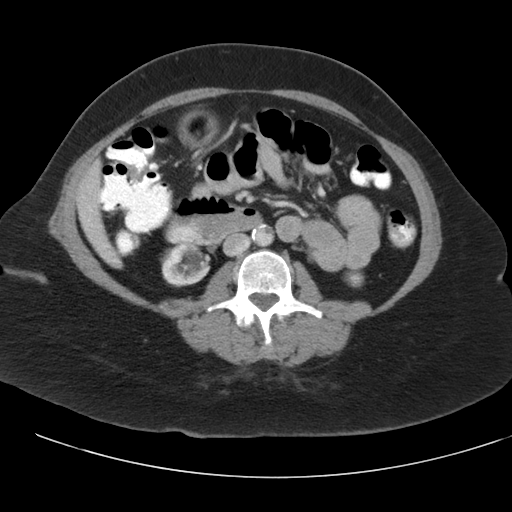
[im 60/86  soft-tissue]
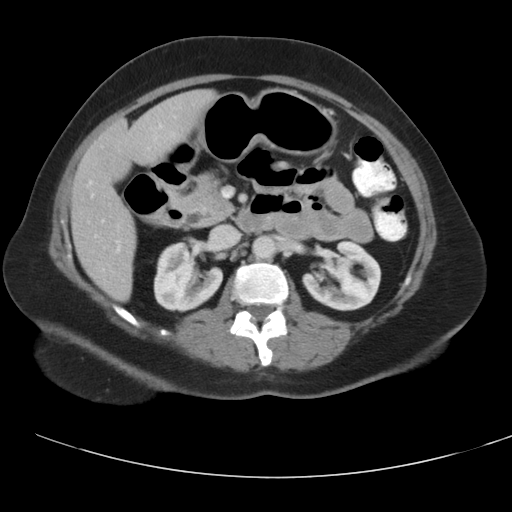
[im 69/86  soft-tissue]
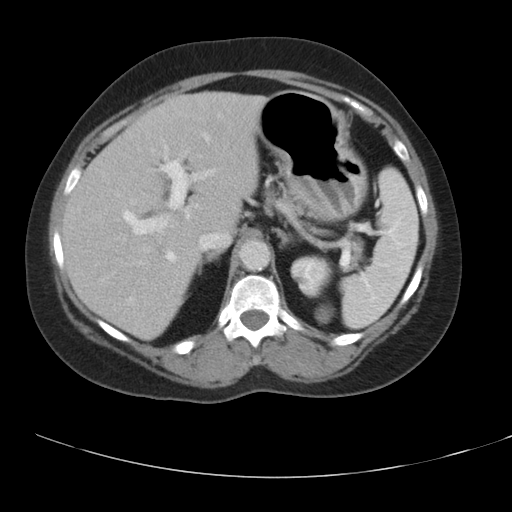
[im 77/86  soft-tissue]
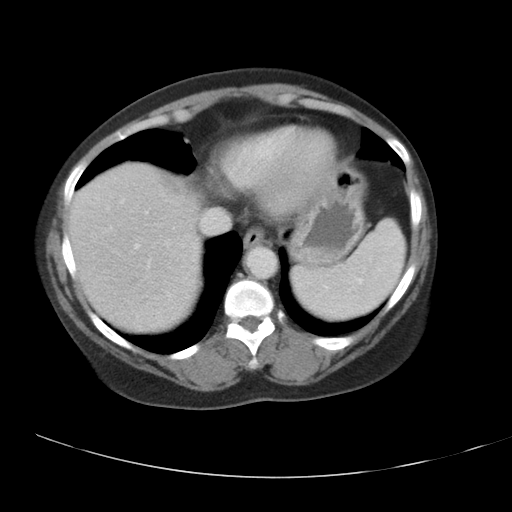
[im 77/86  bone]
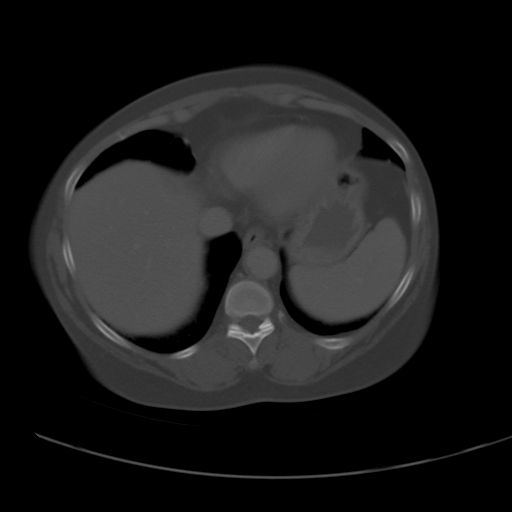

[Series 202: coronals, idose (2) · coronal · 0.50mm/px · 3 of 119 slices shown]
[im 40/119  soft-tissue]
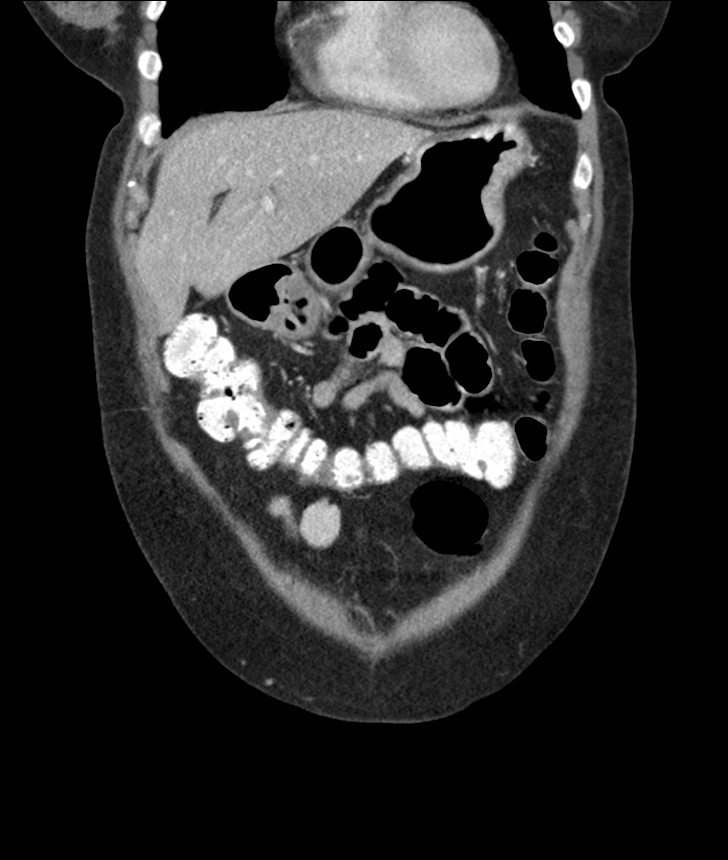
[im 53/119  soft-tissue]
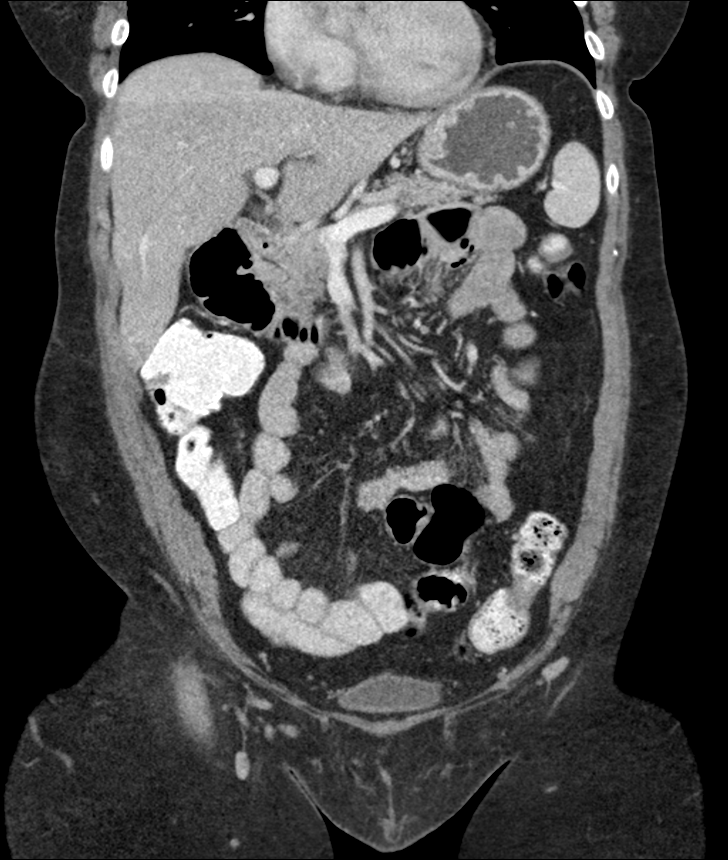
[im 66/119  soft-tissue]
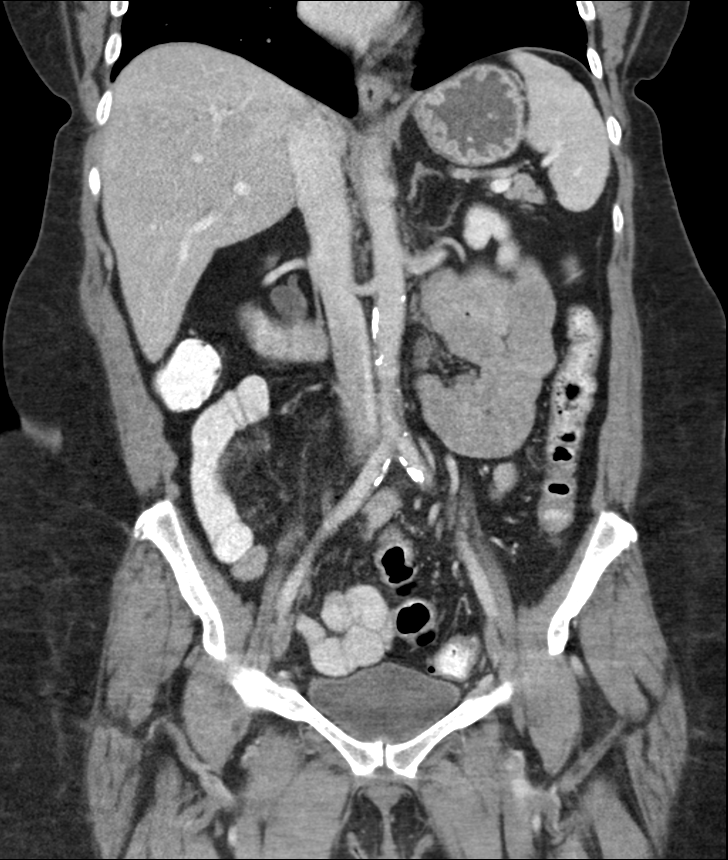

[12 of 46 positions shown; findings below may reference images not displayed]

FINDINGS: Lower chest: Right middle lobe pulmonary measures 4 mm, image number
1/ series 205. Also in the right middle lobe is a 3 mm nodule, image
number 3/ series 205. No pleural effusions identified.

Hepatobiliary: There is no focal liver abnormality. Previous
cholecystectomy. The common bile duct measures up to 7 mm. No stone
or mass identified.

Pancreas: Normal appearance of the pancreas. No pancreatic duct
dilatation.

Spleen: Appears normal.

Adrenals/Urinary Tract: Normal appearance of the adrenal glands.
Hyperdense lesion within the upper pole of the right kidney measures
2.2 cm, image 20/series 201. Angiomyolipoma is identified within the
inferior pole of the right kidney measuring 1 cm. Pelvocaliectasis
is noted within the right kidney. The urinary bladder appears normal
could

Stomach/Bowel: The stomach appears normal. The small bowel loops
have a normal course and caliber. There is no bowel obstruction.
Normal appearance of the proximal colon. Multiple distal colonic
diverticula identified without acute inflammation.

Vascular/Lymphatic: Calcified atherosclerotic disease involves the
abdominal aorta. No aneurysm. No retroperitoneal adenopathy. No
mesenteric adenopathy. No pelvic or inguinal adenopathy.

Reproductive: Previous hysterectomy.  No adnexal mass identified.

Other: Periumbilical hernia is identified which contains fat only.

Musculoskeletal: Review of the visualized bony structures is
unremarkable.
IMPRESSION: 1. No acute findings within the abdomen or pelvis.
2. Indeterminate hyperdense lesion is identified within the upper
pole the left kidney. This may represent a hyperdense cyst or a
solid enhancing lesion. Recommend further evaluation with contrast
enhanced renal protocol MRI or CT of the kidneys.
3. Atherosclerotic disease.
4. Sigmoid diverticulosis without acute inflammation.
5. Small, nonspecific right middle lobe pulmonary nodules. If the
patient is at high risk for bronchogenic carcinoma, follow-up chest
CT at 1 year is recommended. If the patient is at low risk, no
follow-up is needed. This recommendation follows the consensus
statement: Guidelines for Management of Small Pulmonary Nodules
Detected on CT Scans: A Statement from the [HOSPITAL] as
published in Radiology 8995; [DATE].
6. Right kidney AML.

## 2016-03-13 IMAGING — CR DG CHEST 2V
3 series · 3 of 3 positions shown · non-contrast
Comparison: None.

CLINICAL DATA: Wheezing

EXAM:
CHEST  2 VIEW

[w chest lat (1 of 2)]
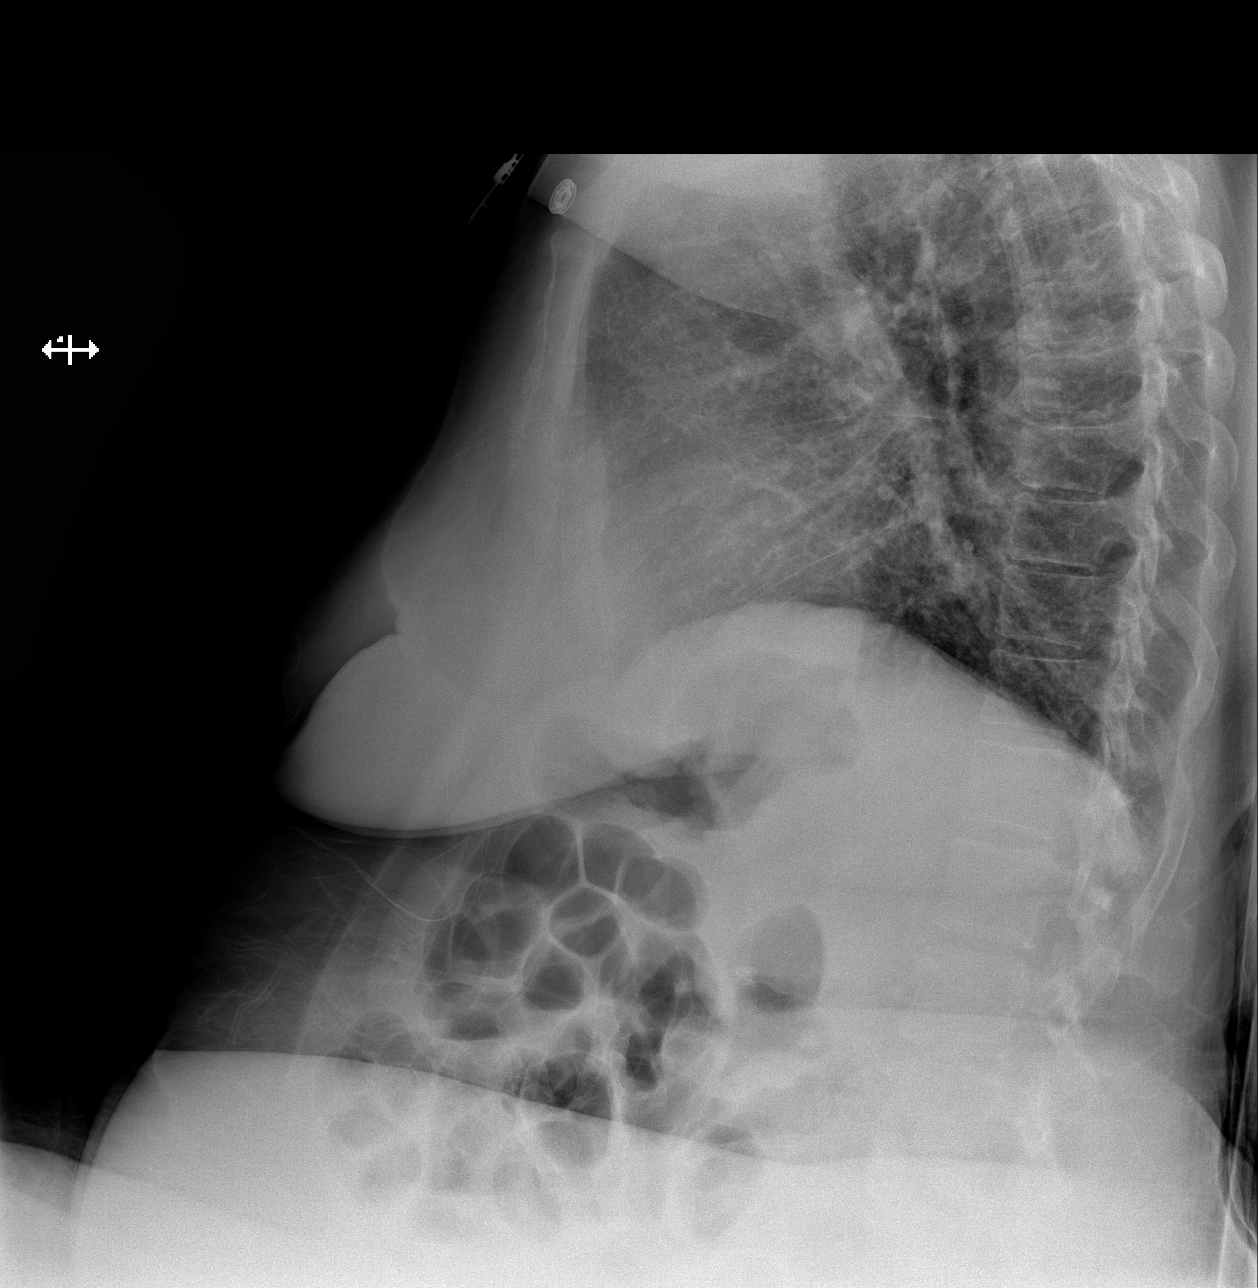

[w chest lat (2 of 2)]
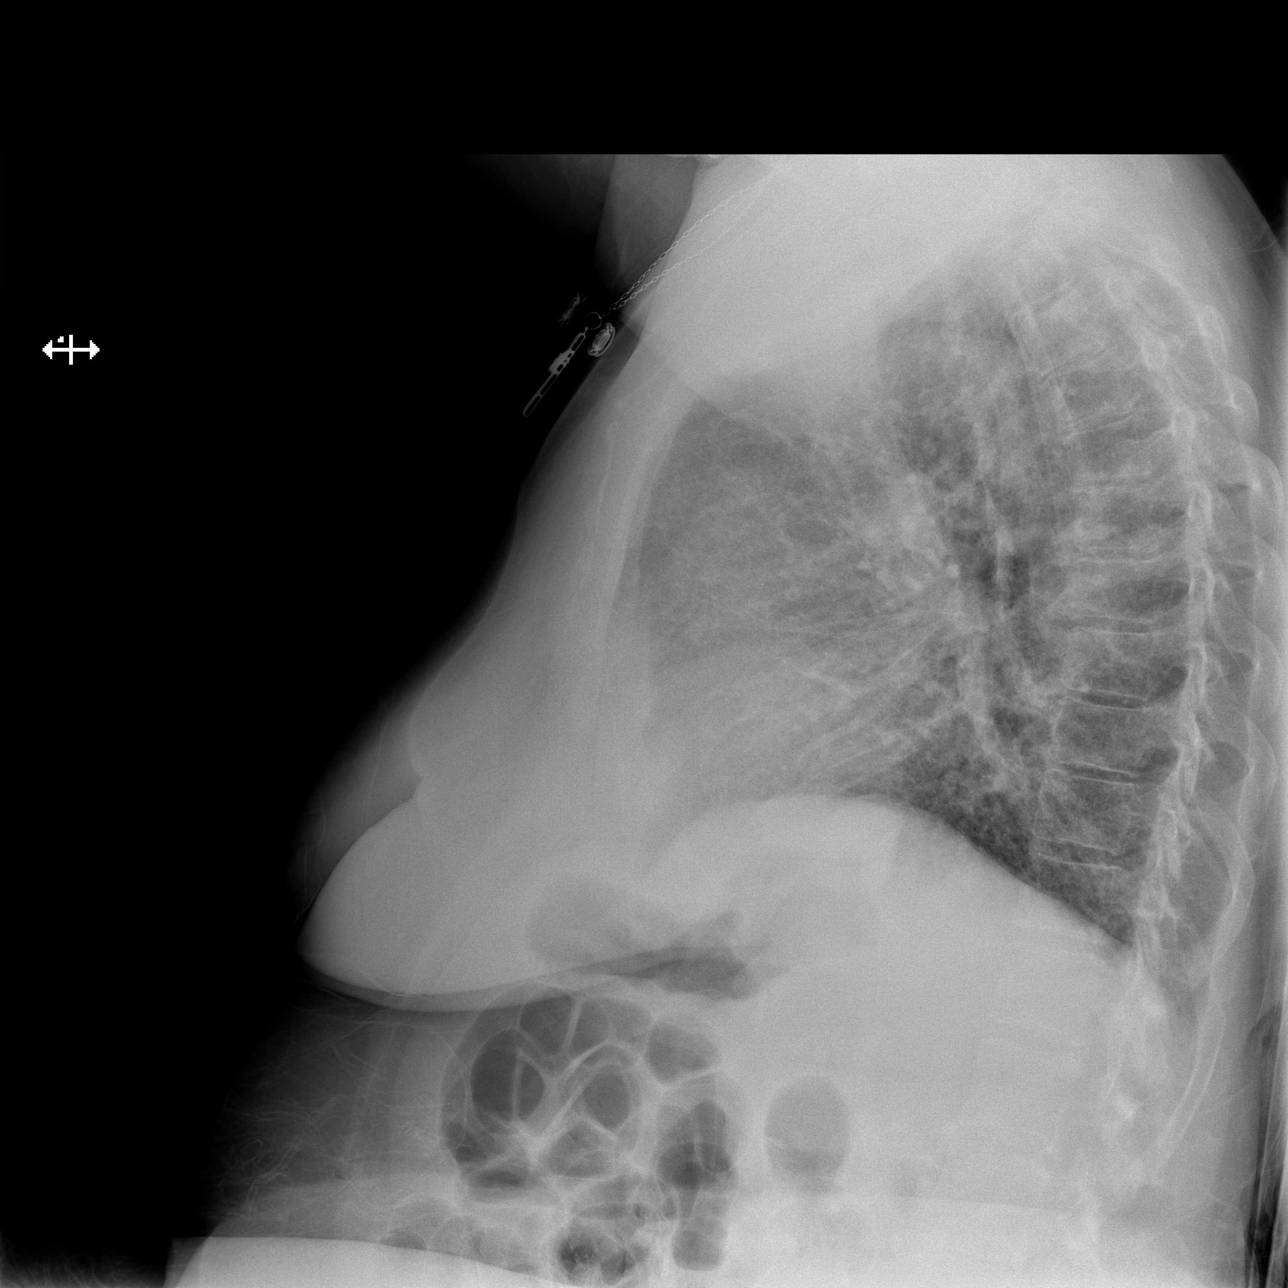

[x chest ap]
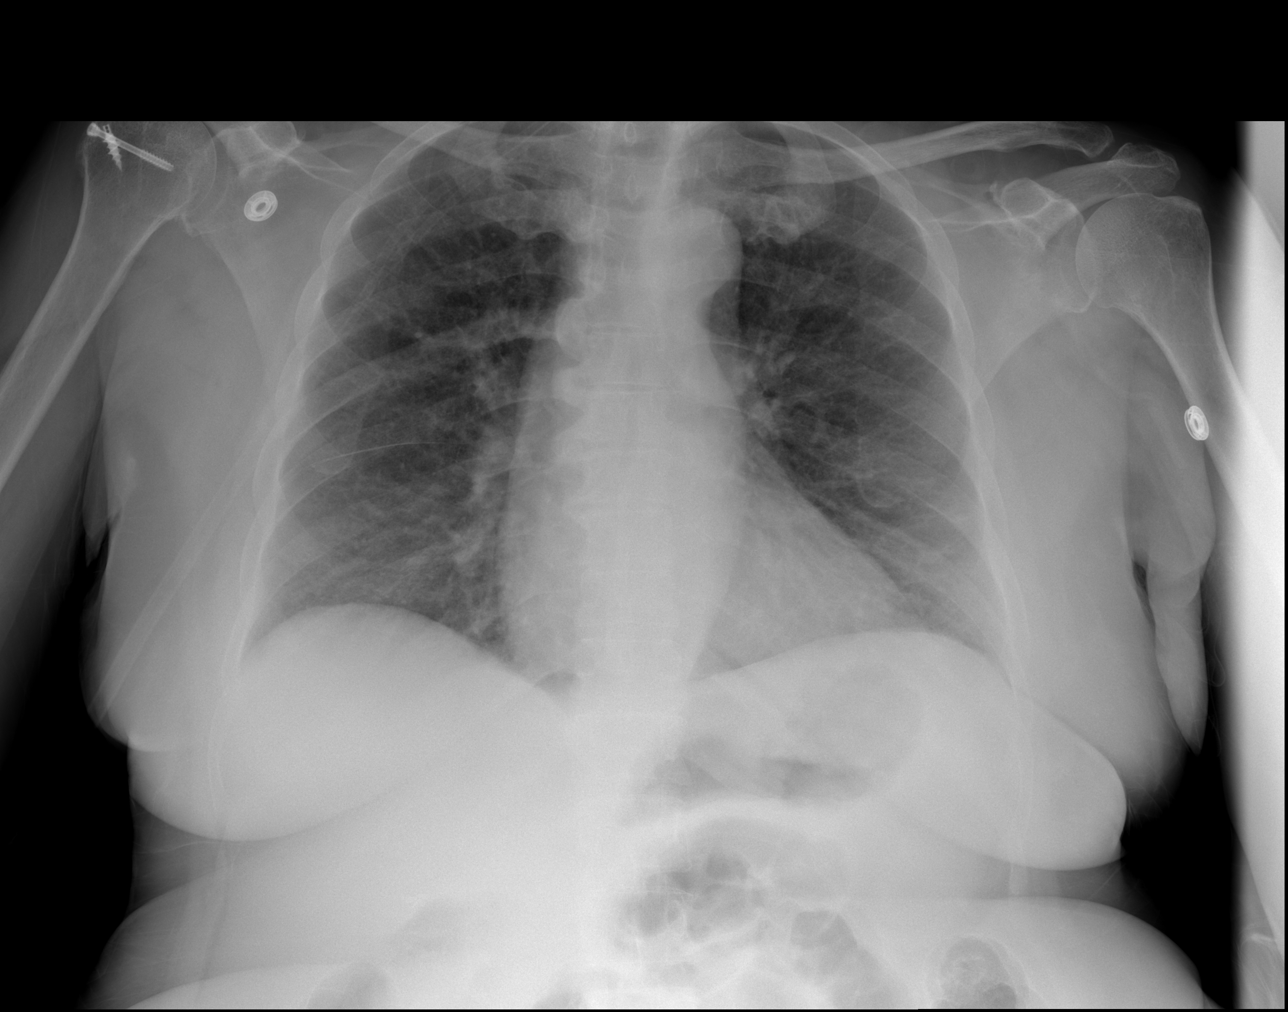

[3 of 3 positions shown; findings below may reference images not displayed]

FINDINGS: Chronic interstitial markings. No focal consolidation. No pleural
effusion or pneumothorax.

The heart is normal in size.

Mild degenerative changes of the visualized thoracolumbar spine.
IMPRESSION: No evidence of acute cardiopulmonary disease.

## 2017-09-21 LAB — VITAMIN D, 25 HYDROXY: Vit D 25-Hydroxy: 26.5 ng/mL — AB (ref 30.0–100.0)

## 2017-09-21 LAB — PTH INTACT: PTH INTACT: 63 pg/mL (ref 15–65)

## 2017-10-18 ENCOUNTER — Ambulatory Visit: Admit: 2017-10-18 | Discharge: 2017-10-18 | Attending: Urology | Primary: Family

## 2017-10-18 DIAGNOSIS — N2889 Other specified disorders of kidney and ureter: Secondary | ICD-10-CM

## 2017-10-18 LAB — AMB POC URINALYSIS DIP STICK AUTO W/O MICRO
Bilirubin (UA POC): NEGATIVE
Bilirubin, Urine, POC: NEGATIVE
Blood (UA POC): NEGATIVE
Blood (UA POC): NEGATIVE
Glucose (UA POC): NEGATIVE
Glucose, Urine, POC: NEGATIVE
Ketones (UA POC): NEGATIVE
Ketones, Urine, POC: NEGATIVE
Leukocyte Esterase, Urine, POC: NEGATIVE
Leukocyte esterase (UA POC): NEGATIVE
Nitrite, Urine, POC: NEGATIVE
Nitrites (UA POC): NEGATIVE
Protein (UA POC): NEGATIVE
Protein, Urine, POC: NEGATIVE
Specific Gravity, Urine, POC: 1.01 NA (ref 1.001–1.035)
Specific gravity (UA POC): 1.01 (ref 1.001–1.035)
Urobilinogen (UA POC): 0.2 (ref 0.2–1)
Urobilinogen, POC: 0.2 (ref 0.2–1)
pH (UA POC): 5.5 (ref 4.6–8.0)
pH, Urine, POC: 5.5 NA (ref 4.6–8.0)

## 2017-10-18 NOTE — Progress Notes (Signed)
ICD-10-CM ICD-9-CM    1. Renal mass N28.89 593.9 AMB POC URINALYSIS DIP STICK AUTO W/O MICRO          ASSESSMENT:    1. Right Renal 7 mm lower pole mass w dilation right lower pole. (No imaging reports on file or provided to pt)         PLAN:    ?? Will contact Graceann Congress, NP [phone]) for imaging reports and disks of imaging performed. No records in our system.   Will call pt when we have imaging and helpful records/ have a plan of action   No records available at this appt time.  Will have staff contact and try to get records sent and actual images asap    Veverly Fells. Silvestre Moment MD FACS  Professor of Urology   [facility]  Urology of [place], [facility]  Director, Endourology Fellowship  [address]  [place], [place] [address]    [phone] ext [phone] (office)  [phone] fax        Chief Complaint   Patient presents with   ??? Renal Mass       HISTORY OF PRESENT ILLNESS:  Lindsey Yang is a 67 y.o. female who presents today in consultation for renal mass, referred by Willey Blade.     Pt is referred for "right 7mm renal mass" by ?MRI. No imaging reports in our system. Pt does not have any imaging or records with her, just a referral.     Pt says she had MRI done for kidney issues? She says she has been told for years she has kidney disease but does not believe this is true.   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 Abnormal urine finding    ??? Cigarette nicotine dependence    ??? CKD (chronic kidney disease), stage III (HCC)    ??? Colon polyp    ??? COPD (chronic obstructive pulmonary disease) (HCC)    ??? GERD (gastroesophageal reflux disease)    ??? Gout     ??? Hyperglycemia    ??? Mixed hyperlipidemia    ??? Right renal mass    ??? Vitamin D deficiency        Past Surgical History:   Procedure Laterality Date   ??? HX CATARACT REMOVAL     ??? HX CHOLECYSTECTOMY     ??? HX COLONOSCOPY     ??? HX HYSTERECTOMY     ??? HX OVARIAN CYST REMOVAL     ??? HX TONSILLECTOMY         Social History     Tobacco Use   ??? Smoking status: Current Some Day Smoker   ??? Smokeless tobacco: Never Used   Substance Use Topics   ??? Alcohol use: Not on file   ??? Drug use: Not on file       Allergies   Allergen Reactions   ??? Iodinated Contrast- Oral And Iv Dye Other (comments)     Bumps all over  skin; was given Epi-pen by MD and told not to ever take it again   ??? Phenobarbital Anaphylaxis and Other (comments)   ??? Shellfish Containing Products Hives and Other (comments)     Bumps all over her skin. Was given an EPI pen by her MD and told to never eat shellfish again.     ??? Glucosamine Hives   ??? Ibuprofen Other (comments) and Unknown (comments)     GI upset     ??? Lamotrigine Other (comments)     Scratchy throat   Scratchy throat   Scratchy throat   Scratchy throat   Scratchy throat   Scratchy throat   Scratchy throat   Scratchy throat   Scratchy throat  Scratchy throat   Scratchy throat   Scratchy throat      ??? Metrizamide Hives   ??? Penicillins Hives   ??? Pregabalin Other (comments)     Rash, hives  Rash, hives     ??? Propoxyphene N-Acetaminophen Other (comments)     GI upset   ??? Sulfamethoxazole-Trimethoprim Other (comments)     Other reaction(s): GI intolerance  Other reaction(s): GI intolerance     ??? Ace Inhibitors Itching     Angioedema   Angioedema   Angioedema   Angioedema   Angioedema   Angioedema   Angioedema   Angioedema   Angioedema   Angioedema   Angioedema          No family history on file.    Current Outpatient Medications   Medication Sig Dispense Refill   ??? acetaminophen-codeine (TYLENOL #3) 300-30 mg per tablet acetaminophen 300 mg-codeine 30 mg tablet      ??? albuterol (PROAIR HFA) 90 mcg/actuation inhaler ProAir HFA 90 mcg/actuation aerosol inhaler     ??? allopurinol (ZYLOPRIM) 300 mg tablet allopurinol 300 mg tablet     ??? amLODIPine (NORVASC) 10 mg tablet amlodipine 10 mg tablet     ??? amLODIPine (NORVASC) 5 mg tablet amlodipine 5 mg tablet     ??? cetirizine (ZYRTEC) 10 mg tablet TAKE ONE TABLET EVERY DAY  12   ??? clonazePAM (KLONOPIN) 1 mg tablet TAKE 1/2 TABLET BY MOUTH AT 9 IN THE MORNING AND 2PM AND A whole TABLET AT BEDTIME  3   ??? dicyclomine (BENTYL) 20 mg tablet Take 1 tablet (20 mg total) by mouth 2 (two) times a day.  2   ??? DULoxetine (CYMBALTA) 60 mg capsule duloxetine 60 mg capsule,delayed release     ??? ergocalciferol (ERGOCALCIFEROL) 50,000 unit capsule Take 1 Cap by mouth.     ??? fluticasone propionate (FLONASE) 50 mcg/actuation nasal spray fluticasone propionate 50 mcg/actuation nasal spray,suspension     ??? furosemide (LASIX) 40 mg tablet furosemide 40 mg tablet     ??? gabapentin (NEURONTIN) 400 mg capsule gabapentin 400 mg capsule     ??? metoprolol succinate (TOPROL-XL) 100 mg tablet metoprolol succinate ER 100 mg tablet,extended release 24 hr     ??? naloxone (NARCAN) 4 mg/actuation nasal spray Narcan 4 mg/actuation nasal spray     ??? oxyCODONE-acetaminophen (PERCOCET 10) 10-325 mg per tablet oxycodone-acetaminophen 10 mg-325 mg tablet   Take 1 tablet 3 times a day by oral route.     ??? potassium chloride SR (KLOR-CON 10) 10 mEq tablet potassium chloride ER 10 mEq tablet,extended release     ??? pravastatin (PRAVACHOL) 20 mg tablet pravastatin 20 mg tablet     ??? traZODone (DESYREL) 100 mg tablet trazodone 100 mg tablet     ???  valACYclovir (VALTREX) 500 mg tablet valacyclovir 500 mg tablet     ??? traMADol (ULTRAM) 50 mg tablet tramadol 50 mg tablet     ??? topiramate (TOPAMAX) 25 mg tablet topiramate 25 mg tablet           Review of Systems  Constitutional: Fever: No  Skin: Rash: No  HEENT: Hearing difficulty: No  Eyes: Blurred vision: No   Cardiovascular: Chest pain: Yes  Respiratory: Shortness of breath: No  Gastrointestinal: Nausea/vomiting: No  Musculoskeletal: Back pain: Yes  Neurological: Weakness: No  Psychological: Memory loss: No  Comments/additional findings:       Visit Vitals  BP 136/74   Ht 4\' 11"  (1.499 m)   Wt 165 lb (74.8 kg)   BMI 33.33 kg/m??     Constitutional: WDWN, Pleasant and appropriate affect, No acute distress.    CV:  No peripheral swelling noted  Respiratory: No respiratory distress or difficulties  Skin: No jaundice.    Neuro/Psych:  Alert and oriented x 3. Affect appropriate.         REVIEW OF LABS AND IMAGING:      Results for orders placed or performed in visit on 10/18/17   AMB POC URINALYSIS DIP STICK AUTO W/O MICRO   Result Value Ref Range    Color (UA POC)      Clarity (UA POC)      Glucose (UA POC) Negative Negative    Bilirubin (UA POC) Negative Negative    Ketones (UA POC) Negative Negative    Specific gravity (UA POC) 1.010 1.001 - 1.035    Blood (UA POC) Negative Negative    pH (UA POC) 5.5 4.6 - 8.0    Protein (UA POC) Negative Negative    Urobilinogen (UA POC) 0.2 mg/dL 0.2 - 1    Nitrites (UA POC) Negative Negative    Leukocyte esterase (UA POC) Negative Negative       Veverly FellsMichael D. Silvestre MomentFabrizio MD FACS  Professor of Urology   [facility] [facility] [employer]  [facility], [facility]  Director, Endourology Fellowship  [address]  [place], [place] [id number]    [phone] (office)  [phone] fax      Medical documentation is provided with the assistance of Samuel GermanyGabrielle Voogt, medical scribe for Samuel GermanyGabrielle Voogt on 10/18/2017

## 2017-10-18 NOTE — Progress Notes (Signed)
Progress Notes by Lindsey Laine, MD at 10/18/17 1445                Author: Waldo Laine, MD  Service: --  Author Type: Physician       Filed: 10/22/17 1322  Encounter Date: 10/18/2017  Status: Signed          Editor: Lindsey Laine, MD (Physician)                                           ICD-10-CM  ICD-9-CM             1.  Renal mass  N28.89  593.9  AMB POC URINALYSIS DIP STICK AUTO W/O MICRO               ASSESSMENT:      1. Right Renal 7 mm lower pole mass w dilation right lower pole. (No imaging reports on file or provided to pt)             PLAN:     ??  Will contact Lindsey Congress, NP [phone]) for imaging reports and disks of imaging performed. No records in our system.    Will call pt when we have imaging and helpful records/ have a plan of action    No records available at this appt time.   Will have staff contact and try to get records sent and actual images asap      Lindsey Yang. Silvestre Moment MD FACS   Professor of Urology    [facility]   Urology of [place], [facility]   Director, Endourology Fellowship   [address]   [place], [place] [address]      [phone] ext [phone] (office)   [phone] fax              Chief Complaint       Patient presents with        ?  Renal Mass           HISTORY OF PRESENT ILLNESS:  [name]  is a 67 y.o. female who presents  today in consultation for renal mass, referred by Willey Blade.       Pt is referred for "right 7mm renal mass" by ?MRI. No imaging reports in our system. Pt does not have any imaging or records with her, just a referral.       Pt says she had MRI done for kidney issues? She says she has been told for years she has kidney disease but does not believe this is true.    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  Abnormal urine finding       ?  Cigarette nicotine dependence       ?  CKD (chronic kidney disease), stage III (HCC)       ?  Colon polyp       ?  COPD (chronic obstructive pulmonary disease) (HCC)       ?  GERD (gastroesophageal reflux disease)       ?  Gout       ?  Hyperglycemia       ?  Mixed hyperlipidemia       ?  Right renal mass           ?  Vitamin D deficiency               Past Surgical History:         Procedure  Laterality  Date          ?  HX CATARACT REMOVAL         ?  HX CHOLECYSTECTOMY         ?  HX COLONOSCOPY         ?  HX HYSTERECTOMY         ?  HX OVARIAN CYST REMOVAL              ?  HX TONSILLECTOMY                 Social History          Tobacco Use         ?  Smoking status:  Current Some Day Smoker     ?  Smokeless tobacco:  Never Used       Substance Use Topics         ?  Alcohol use:  Not on file         ?  Drug use:  Not on file             Allergies        Allergen  Reactions         ?  Iodinated Contrast- Oral And Iv Dye  Other (comments)             Bumps all over skin; was given Epi-pen by MD and told not to ever take it again         ?  Phenobarbital  Anaphylaxis and Other (comments)     ?  Shellfish Containing Products  Hives and Other (comments)             Bumps all over her skin. Was given an EPI pen by her MD and told to never eat shellfish again.            ?  Glucosamine  Hives     ?  Ibuprofen  Other (comments) and Unknown (comments)             GI upset            ?  Lamotrigine  Other (comments)             Scratchy throat    Scratchy throat    Scratchy throat    Scratchy throat    Scratchy throat    Scratchy throat    Scratchy throat    Scratchy throat    Scratchy throat   Scratchy throat    Scratchy throat    Scratchy throat             ?  Metrizamide  Hives     ?  Penicillins  Hives     ?  Pregabalin  Other  (comments)             Rash, hives   Rash, hives            ?  Propoxyphene N-Acetaminophen  Other (comments)             GI upset         ?  Sulfamethoxazole-Trimethoprim  Other (comments)             Other reaction(s): GI intolerance   Other reaction(s): GI intolerance            ?  Ace Inhibitors  Itching             Angioedema    Angioedema    Angioedema    Angioedema    Angioedema    Angioedema    Angioedema    Angioedema    Angioedema    Angioedema    Angioedema               No family history on file.        Current Outpatient Medications          Medication  Sig  Dispense  Refill           ?  acetaminophen-codeine (TYLENOL #3) 300-30 mg per tablet  acetaminophen 300 mg-codeine 30 mg tablet         ?  albuterol (PROAIR HFA) 90 mcg/actuation inhaler  ProAir HFA 90 mcg/actuation aerosol inhaler         ?  allopurinol (ZYLOPRIM) 300 mg tablet  allopurinol 300 mg tablet         ?  amLODIPine (NORVASC) 10 mg tablet  amlodipine 10 mg tablet         ?  amLODIPine (NORVASC) 5 mg tablet  amlodipine 5 mg tablet         ?  cetirizine (ZYRTEC) 10 mg tablet  TAKE ONE TABLET EVERY DAY    12     ?  clonazePAM (KLONOPIN) 1 mg tablet  TAKE 1/2 TABLET BY MOUTH AT 9 IN THE MORNING AND 2PM AND A whole TABLET AT BEDTIME    3     ?  dicyclomine (BENTYL) 20 mg tablet  Take 1 tablet (20 mg total) by mouth 2 (two) times a day.    2     ?  DULoxetine (CYMBALTA) 60 mg capsule  duloxetine 60 mg capsule,delayed release         ?  ergocalciferol (ERGOCALCIFEROL) 50,000 unit capsule  Take 1 Cap by mouth.         ?  fluticasone propionate (FLONASE) 50 mcg/actuation nasal spray  fluticasone propionate 50 mcg/actuation nasal spray,suspension         ?  furosemide (LASIX) 40 mg tablet  furosemide 40 mg tablet         ?  gabapentin (NEURONTIN) 400 mg capsule  gabapentin 400 mg capsule         ?  metoprolol succinate (TOPROL-XL) 100 mg tablet  metoprolol succinate ER 100 mg tablet,extended release 24 hr         ?  naloxone (NARCAN) 4  mg/actuation nasal spray  Narcan 4 mg/actuation nasal spray         ?  oxyCODONE-acetaminophen (PERCOCET 10) 10-325 mg per tablet  oxycodone-acetaminophen 10 mg-325 mg tablet    Take 1 tablet 3 times a day by oral route.         ?  potassium chloride SR (KLOR-CON 10) 10 mEq tablet  potassium chloride ER 10 mEq tablet,extended release         ?  pravastatin (PRAVACHOL) 20 mg tablet  pravastatin 20 mg tablet         ?  traZODone (DESYREL) 100 mg tablet  trazodone 100 mg tablet         ?  valACYclovir (VALTREX) 500 mg tablet  valacyclovir 500 mg tablet         ?  traMADol (ULTRAM) 50 mg tablet  tramadol 50 mg tablet               ?  topiramate (TOPAMAX) 25 mg tablet  topiramate 25 mg tablet                  Review of Systems   Constitutional: Fever: No   Skin: Rash: No   HEENT: Hearing difficulty: No   Eyes: Blurred vision: No   Cardiovascular: Chest pain: Yes   Respiratory: Shortness of breath: No   Gastrointestinal: Nausea/vomiting: No   Musculoskeletal: Back pain: Yes   Neurological: Weakness: No   Psychological: Memory loss: No   Comments/additional findings:          Visit Vitals      BP  136/74     Ht  4\' 11"  (1.499 m)     Wt  165 lb (74.8 kg)        BMI  33.33 kg/m??        Constitutional: WDWN, Pleasant and appropriate affect, No acute distress .     CV:  No peripheral swelling noted   Respiratory: No respiratory distress or difficulties   Skin: No jaundice.     Neuro/Psych:  Alert and o riented x 3. Affect  appropriate.             REVIEW OF LABS AND IMAGING:          Results for orders placed or performed in visit on 10/18/17     AMB POC URINALYSIS DIP STICK AUTO W/O MICRO         Result  Value  Ref Range            Color (UA POC)           Clarity (UA POC)           Glucose (UA POC)  Negative  Negative       Bilirubin (UA POC)  Negative  Negative       Ketones (UA POC)  Negative  Negative       Specific gravity (UA POC)  1.010  1.001 - 1.035       Blood (UA POC)  Negative  Negative       pH (UA POC)  5.5  4.6  - 8.0       Protein (UA POC)  Negative  Negative       Urobilinogen (UA POC)  0.2 mg/dL  0.2 - 1       Nitrites (UA POC)  Negative  Negative            Leukocyte esterase (UA POC)  Negative  Negative           Lindsey Yang. Ubaldo Daywalt MD FACS   Professor of Urology    [facility]   Urology of [place], [facility]   Director, Endourology Fellowship   [address]   [place], [place] [address]  [phone] ext [phone] (office)   [phone] fax         Medical documentation is provided with the assistance of Samuel Germany, medical scribe for [employer] on  10/18/2017

## 2017-10-19 NOTE — Progress Notes (Signed)
Plan:  Get renal us on shore at [facility]  Please book cysto and retrograde at her next appt after US  Also get MRI put in the pacs system so I can view or get CD of images sent to us  Thanks    Lili Harts D. Terren Haberle MD FACS  Professor of Urology   [employer]  [facility] [place]  Director, Endourology Fellowship  [address] [place] [address]    [phone] ext [phone] (office)  [phone] fax

## 2017-10-19 NOTE — Progress Notes (Signed)
Plan:  [facility]  Please book cysto and retrograde at her next appt after US  Also get MRI put in the pacs system so I can view or get CD of images sent to us  Thanks    Casimiro NeedleMichael D. Silvestre MomentFabrizio MD FACS  Professor of Urology   [facility]  [facility] [place]  Director, Endourology Fellowship  [address]  [place], [place] [id number]    [phone] ext 2 (office)  [phone] fax

## 2017-10-29 ENCOUNTER — Encounter: Attending: Urology | Primary: Family

## 2017-11-01 ENCOUNTER — Encounter

## 2017-11-01 NOTE — Progress Notes (Signed)
Faxed imaging to [facility] [phone]; RUS; F/U 11/04/17; ASAP

## 2017-11-01 NOTE — Progress Notes (Signed)
Lindsey Yang has order forRUS     To be done at [facility]     Needed by  Prior to apt with Dr Kwong.    Patient has a follow-up appointment: Yes     Best number to contact patient:     [phone] or   [phone]  Order has been placed in connect care Yes    Is this a STAT order     [name] [name]

## 2017-11-01 NOTE — Progress Notes (Signed)
Per [facility], they were unsuccessful to reach the patient. Called the patient today -left vcml stating to please call [facility] to schedule imaging at [phone] opt 2 or to call back to our office if he had any questions or concerns.

## 2017-11-01 NOTE — Progress Notes (Signed)
[phone]; RUS; F/U 11/04/17; ASAP

## 2017-11-01 NOTE — Progress Notes (Signed)
Per [facility], they were unsuccessful to reach the patient. Called the patient today -left vcml stating to please call [facility] to schedule imaging at [phone] opt 2 or to call back to our office if he had any questions or concerns.

## 2017-11-01 NOTE — Progress Notes (Signed)
[name] has order forRUS     [facility]     Needed by  Prior to apt with Dr Louretta ParmaKwong.    Patient has a follow-up appointment: Yes     Best number to contact patient:     [phone] or   [phone]  Order has been placed in connect care Yes    Is this a STAT order     Mauricio PoStephanie L. Dusza

## 2017-11-04 ENCOUNTER — Ambulatory Visit: Admit: 2017-11-04 | Discharge: 2017-11-04 | Attending: Urology | Primary: Family

## 2017-11-04 ENCOUNTER — Ambulatory Visit: Attending: Urology | Primary: Family

## 2017-11-04 DIAGNOSIS — N2889 Other specified disorders of kidney and ureter: Secondary | ICD-10-CM

## 2017-11-04 LAB — AMB POC URINALYSIS DIP STICK AUTO W/O MICRO
Bilirubin (UA POC): NEGATIVE
Bilirubin, Urine, POC: NEGATIVE
Blood (UA POC): NEGATIVE
Blood (UA POC): NEGATIVE
Glucose (UA POC): NEGATIVE
Glucose, Urine, POC: NEGATIVE
Ketones (UA POC): NEGATIVE
Ketones, Urine, POC: NEGATIVE
Leukocyte Esterase, Urine, POC: NEGATIVE
Leukocyte esterase (UA POC): NEGATIVE
Nitrite, Urine, POC: NEGATIVE
Nitrites (UA POC): NEGATIVE
Specific Gravity, Urine, POC: 1.015 NA (ref 1.001–1.035)
Specific gravity (UA POC): 1.015 (ref 1.001–1.035)
Urobilinogen (UA POC): 0.2 (ref 0.2–1)
Urobilinogen, POC: 0.2 (ref 0.2–1)
pH (UA POC): 5.5 (ref 4.6–8.0)
pH, Urine, POC: 5.5 NA (ref 4.6–8.0)

## 2017-11-04 NOTE — Progress Notes (Signed)
11/04/2017    ASSESSMENT:  1. Right lower pole renal nodule 7mm w/ dilation of lower pole calyces by 09/20/17 MRI abd    2. Subcentimeter right renal cysts by 09/20/17 MRI abd -- pt reassured, no intervention needed  3. Right flank pain, ?etiology -- not due to Kidney nodule or cysts    PLAN:    1. Reviewed 10/21/17 MRI abd   2. Complete Renal US at [facility] -- pt to call for results  3. Pt directed to f/u with her PCP to discuss right flank, chronic hip pain    RTC in 6 months if Renal US w/ no concerning findings.    Patient's BMI is out of the normal parameters.  Information about BMI was given to the patient.  Patient to f/u w/ PCP for further management of this issue if needed.     CC:  Renal mass    Ted McalpineCynthia E Gangwer is a 67 y.o. black female who returns for evaluation of a right renal mass.    Previously seen by Dr. Silvestre MomentFabrizio for this issues on 10/18/17.     >>> Pt is doing well today. Pt does not have car and arrived via medical transport.   She does report recent right flank pain, likely non-urological etiology.   Otherwise, no bothersome voiding sx's reported.   Denies gross hematuria, dysuria. No f/c/n/v.     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     10/21/17 MRI abd [facility]  IMPRESSION  1. Right lower pole renal cortical nodule 7mm. Difficult to further evaluate w/o contrast. Proteinaceous cyst, angiomyolipoma as well as a solid mass are possible. Need comparison with previous exam and cross-sectional imaging with contrast.   2. Marked dilation right lower pole ranel pelvis.   3. Bilateral additional renal lesions, too small to characterize but likely small cysts.     PMH/PSH/SocHx/FamHx/Meds & allergies reviewed.   Past Medical History:   Diagnosis Date   ??? Abnormal urine finding    ??? Cigarette nicotine dependence     ??? CKD (chronic kidney disease), stage III (HCC)    ??? Colon polyp    ??? COPD (chronic obstructive pulmonary disease) (HCC)    ??? GERD (gastroesophageal reflux disease)    ??? Gout    ??? Hyperglycemia    ??? Mixed hyperlipidemia    ??? Right renal mass    ??? Vitamin D deficiency      Past Surgical History:   Procedure Laterality Date   ??? HX CATARACT REMOVAL     ??? HX CHOLECYSTECTOMY     ??? HX COLONOSCOPY     ??? HX HYSTERECTOMY     ??? HX OVARIAN CYST REMOVAL     ??? HX TONSILLECTOMY       Social History     Socioeconomic History   ??? Marital status: WIDOWED     Spouse name: Not on file   ??? Number of children: Not on file   ??? Years of education: Not on file   ??? Highest education level: Not on file   Occupational History   ??? Not on file   Social Needs   ??? Financial resource strain: Not on file   ??? Food insecurity:  Worry: Not on file     Inability: Not on file   ??? Transportation needs:     Medical: Not on file     Non-medical: Not on file   Tobacco Use   ??? Smoking status: Former Smoker   ??? Smokeless tobacco: Never Used   Substance and Sexual Activity   ??? Alcohol use: Not on file   ??? Drug use: Not on file   ??? Sexual activity: Not on file   Lifestyle   ??? Physical activity:     Days per week: Not on file     Minutes per session: Not on file   ??? Stress: Not on file   Relationships   ??? Social connections:     Talks on phone: Not on file     Gets together: Not on file     Attends religious service: Not on file     Active member of club or organization: Not on file     Attends meetings of clubs or organizations: Not on file     Relationship status: Not on file   ??? Intimate partner violence:     Fear of current or ex partner: Not on file     Emotionally abused: Not on file     Physically abused: Not on file     Forced sexual activity: Not on file   Other Topics Concern   ??? Not on file   Social History Narrative   ??? Not on file     History reviewed. No pertinent family history.  Allergies   Allergen Reactions    ??? Iodinated Contrast- Oral And Iv Dye Other (comments)     Bumps all over skin; was given Epi-pen by MD and told not to ever take it again   ??? Phenobarbital Anaphylaxis and Other (comments)   ??? Shellfish Containing Products Hives and Other (comments)     Bumps all over her skin. Was given an EPI pen by her MD and told to never eat shellfish again.     ??? Glucosamine Hives   ??? Ibuprofen Other (comments) and Unknown (comments)     GI upset     ??? Lamotrigine Other (comments)     Scratchy throat   Scratchy throat   Scratchy throat   Scratchy throat   Scratchy throat   Scratchy throat   Scratchy throat   Scratchy throat   Scratchy throat  Scratchy throat   Scratchy throat   Scratchy throat      ??? Metrizamide Hives   ??? Penicillins Hives   ??? Pregabalin Other (comments)     Rash, hives  Rash, hives     ??? Propoxyphene N-Acetaminophen Other (comments)     GI upset   ??? Sulfamethoxazole-Trimethoprim Other (comments)     Other reaction(s): GI intolerance  Other reaction(s): GI intolerance     ??? Ace Inhibitors Itching     Angioedema   Angioedema   Angioedema   Angioedema   Angioedema   Angioedema   Angioedema   Angioedema   Angioedema   Angioedema   Angioedema        Current Outpatient Medications on File Prior to Visit   Medication Sig Dispense Refill   ??? SUMAtriptan (IMITREX) 100 mg tablet Take 100 mg by mouth once as needed for Migraine.     ??? acetaminophen-codeine (TYLENOL #3) 300-30 mg per tablet acetaminophen 300 mg-codeine 30 mg tablet     ??? albuterol (PROAIR HFA) 90 mcg/actuation inhaler  ProAir HFA 90 mcg/actuation aerosol inhaler     ??? allopurinol (ZYLOPRIM) 300 mg tablet allopurinol 300 mg tablet     ??? amLODIPine (NORVASC) 10 mg tablet amlodipine 10 mg tablet     ??? cetirizine (ZYRTEC) 10 mg tablet TAKE ONE TABLET EVERY DAY  12   ??? clonazePAM (KLONOPIN) 1 mg tablet TAKE 1/2 TABLET BY MOUTH AT 9 IN THE MORNING AND 2PM AND A whole TABLET AT BEDTIME  3    ??? dicyclomine (BENTYL) 20 mg tablet Take 1 tablet (20 mg total) by mouth 2 (two) times a day.  2   ??? DULoxetine (CYMBALTA) 60 mg capsule duloxetine 60 mg capsule,delayed release     ??? ergocalciferol (ERGOCALCIFEROL) 50,000 unit capsule Take 1 Cap by mouth.     ??? gabapentin (NEURONTIN) 400 mg capsule gabapentin 400 mg capsule     ??? metoprolol succinate (TOPROL-XL) 100 mg tablet metoprolol succinate ER 100 mg tablet,extended release 24 hr     ??? naloxone (NARCAN) 4 mg/actuation nasal spray Narcan 4 mg/actuation nasal spray     ??? oxyCODONE-acetaminophen (PERCOCET 10) 10-325 mg per tablet oxycodone-acetaminophen 10 mg-325 mg tablet   Take 1 tablet 3 times a day by oral route.     ??? potassium chloride SR (KLOR-CON 10) 10 mEq tablet potassium chloride ER 10 mEq tablet,extended release     ??? pravastatin (PRAVACHOL) 20 mg tablet pravastatin 20 mg tablet     ??? topiramate (TOPAMAX) 25 mg tablet topiramate 25 mg tablet       No current facility-administered medications on file prior to visit.      Results for orders placed or performed in visit on 11/04/17   AMB POC URINALYSIS DIP STICK AUTO W/O MICRO   Result Value Ref Range    Color (UA POC) Yellow     Clarity (UA POC) Clear     Glucose (UA POC) Negative Negative    Bilirubin (UA POC) Negative Negative    Ketones (UA POC) Negative Negative    Specific gravity (UA POC) 1.015 1.001 - 1.035    Blood (UA POC) Negative Negative    pH (UA POC) 5.5 4.6 - 8.0    Protein (UA POC) 3+ Negative    Urobilinogen (UA POC) 0.2 mg/dL 0.2 - 1    Nitrites (UA POC) Negative Negative    Leukocyte esterase (UA POC) Negative Negative     Physical Exam:  Visit Vitals  BP 128/78   Ht 4\' 11"  (1.499 m)   Wt 159 lb (72.1 kg)   BMI 32.11 kg/m??     Constitutional: Well developed, well-nourished BF in no acute distress.   CV:  RRR  Respiratory: No respiratory distress or difficulties  Abdomen:  Soft ND, diffuse abd tenderness. No masses.    GU: Bilat CVAT, diffuse back tenderness   Extremities: No peripheral swelling noted  MSK: FROM  Skin: No bruising or rashes.      Neuro/Psych:  Patient with appropriate affect.  Alert and oriented x 3.    Lymphatic:   No enlargement of neck / subclavian lymph nodes    Review of Systems  Constitutional: Fever: No  Skin: Rash: No  HEENT: Hearing difficulty: No  Eyes: Blurred vision: No  Cardiovascular: Chest pain: No  Respiratory: Shortness of breath: No  Gastrointestinal: Nausea/vomiting: No  Musculoskeletal: Back pain: Yes  Neurological: Weakness: No  Psychological: Memory loss: No  Comments/additional findings:       Medical documentation provided by Clarise Cruz, medical  scribe for Etheleen NicksPeter O Daniyal Tabor, MD     Etheleen NicksPeter O Firman Petrow, MD

## 2017-11-04 NOTE — Progress Notes (Signed)
11/04/2017    ASSESSMENT:  1. Right lower pole renal nodule 7mm w/ dilation of lower pole calyces by 09/20/17 MRI abd    2. Subcentimeter right renal cysts by 09/20/17 MRI abd -- pt reassured, no intervention needed  3. Right flank pain, ?etiology -- not due to Kidney nodule or cysts    PLAN:    1. Reviewed 10/21/17 MRI abd   2. Complete Renal US at [facility] -- pt to call for results  3. Pt directed to f/u with her PCP to discuss right flank, chronic hip pain    RTC in 6 months if Renal US w/ no concerning findings.    Patient's BMI is out of the normal parameters.  Information about BMI was given to the patient.  Patient to f/u w/ PCP for further management of this issue if needed.     CC:  Renal mass    Lindsey Yang is a 67 y.o. black female who returns for evaluation of a right renal mass.    Previously seen by Dr. Silvestre Moment for this issues on 10/18/17.     >>> Pt is doing well today. Pt does not have car and arrived via medical transport.   She does report recent right flank pain, likely non-urological etiology.   Otherwise, no bothersome voiding sx's reported.   Denies gross hematuria, dysuria. No f/c/n/v.     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     10/21/17 MRI abd [facility])  IMPRESSION  1. Right lower pole renal cortical nodule 7mm. Difficult to further evaluate w/o contrast. Proteinaceous cyst, angiomyolipoma as well as a solid mass are possible. Need comparison with previous exam and cross-sectional imaging with contrast.   2. Marked dilation right lower pole ranel pelvis.   3. Bilateral additional renal lesions, too small to characterize but likely small cysts.     PMH/PSH/SocHx/FamHx/Meds & allergies reviewed.   Past Medical History:   Diagnosis Date   ??? Abnormal urine finding    ??? Cigarette nicotine dependence    ???  CKD (chronic kidney disease), stage III (HCC)    ??? Colon polyp    ??? COPD (chronic obstructive pulmonary disease) (HCC)    ??? GERD (gastroesophageal reflux disease)    ??? Gout    ??? Hyperglycemia    ??? Mixed hyperlipidemia    ??? Right renal mass    ??? Vitamin D deficiency      Past Surgical History:   Procedure Laterality Date   ??? HX CATARACT REMOVAL     ??? HX CHOLECYSTECTOMY     ??? HX COLONOSCOPY     ??? HX HYSTERECTOMY     ??? HX OVARIAN CYST REMOVAL     ??? HX TONSILLECTOMY       Social History     Socioeconomic History   ??? Marital status: WIDOWED     Spouse name: Not on file   ??? Number of children: Not on file   ??? Years of education: Not on file   ??? Highest education level: Not on file   Occupational History   ??? Not on file   Social Needs   ??? Financial resource strain: Not on file   ??? Food insecurity:  Worry: Not on file     Inability: Not on file   ??? Transportation needs:     Medical: Not on file     Non-medical: Not on file   Tobacco Use   ??? Smoking status: Former Smoker   ??? Smokeless tobacco: Never Used   Substance and Sexual Activity   ??? Alcohol use: Not on file   ??? Drug use: Not on file   ??? Sexual activity: Not on file   Lifestyle   ??? Physical activity:     Days per week: Not on file     Minutes per session: Not on file   ??? Stress: Not on file   Relationships   ??? Social connections:     Talks on phone: Not on file     Gets together: Not on file     Attends religious service: Not on file     Active member of club or organization: Not on file     Attends meetings of clubs or organizations: Not on file     Relationship status: Not on file   ??? Intimate partner violence:     Fear of current or ex partner: Not on file     Emotionally abused: Not on file     Physically abused: Not on file     Forced sexual activity: Not on file   Other Topics Concern   ??? Not on file   Social History Narrative   ??? Not on file     History reviewed. No pertinent family history.  Allergies   Allergen Reactions   ??? Iodinated Contrast- Oral And Iv  Dye Other (comments)     Bumps all over skin; was given Epi-pen by MD and told not to ever take it again   ??? Phenobarbital Anaphylaxis and Other (comments)   ??? Shellfish Containing Products Hives and Other (comments)     Bumps all over her skin. Was given an EPI pen by her MD and told to never eat shellfish again.     ??? Glucosamine Hives   ??? Ibuprofen Other (comments) and Unknown (comments)     GI upset     ??? Lamotrigine Other (comments)     Scratchy throat   Scratchy throat   Scratchy throat   Scratchy throat   Scratchy throat   Scratchy throat   Scratchy throat   Scratchy throat   Scratchy throat  Scratchy throat   Scratchy throat   Scratchy throat      ??? Metrizamide Hives   ??? Penicillins Hives   ??? Pregabalin Other (comments)     Rash, hives  Rash, hives     ??? Propoxyphene N-Acetaminophen Other (comments)     GI upset   ??? Sulfamethoxazole-Trimethoprim Other (comments)     Other reaction(s): GI intolerance  Other reaction(s): GI intolerance     ??? Ace Inhibitors Itching     Angioedema   Angioedema   Angioedema   Angioedema   Angioedema   Angioedema   Angioedema   Angioedema   Angioedema   Angioedema   Angioedema        Current Outpatient Medications on File Prior to Visit   Medication Sig Dispense Refill   ??? SUMAtriptan (IMITREX) 100 mg tablet Take 100 mg by mouth once as needed for Migraine.     ??? acetaminophen-codeine (TYLENOL #3) 300-30 mg per tablet acetaminophen 300 mg-codeine 30 mg tablet     ??? albuterol (PROAIR HFA) 90 mcg/actuation inhaler  ProAir HFA 90 mcg/actuation aerosol inhaler     ??? allopurinol (ZYLOPRIM) 300 mg tablet allopurinol 300 mg tablet     ??? amLODIPine (NORVASC) 10 mg tablet amlodipine 10 mg tablet     ??? cetirizine (ZYRTEC) 10 mg tablet TAKE ONE TABLET EVERY DAY  12   ??? clonazePAM (KLONOPIN) 1 mg tablet TAKE 1/2 TABLET BY MOUTH AT 9 IN THE MORNING AND 2PM AND A whole TABLET AT BEDTIME  3   ??? dicyclomine (BENTYL) 20 mg tablet Take 1 tablet (20 mg total) by mouth 2 (two) times a day.  2   ???  DULoxetine (CYMBALTA) 60 mg capsule duloxetine 60 mg capsule,delayed release     ??? ergocalciferol (ERGOCALCIFEROL) 50,000 unit capsule Take 1 Cap by mouth.     ??? gabapentin (NEURONTIN) 400 mg capsule gabapentin 400 mg capsule     ??? metoprolol succinate (TOPROL-XL) 100 mg tablet metoprolol succinate ER 100 mg tablet,extended release 24 hr     ??? naloxone (NARCAN) 4 mg/actuation nasal spray Narcan 4 mg/actuation nasal spray     ??? oxyCODONE-acetaminophen (PERCOCET 10) 10-325 mg per tablet oxycodone-acetaminophen 10 mg-325 mg tablet   Take 1 tablet 3 times a day by oral route.     ??? potassium chloride SR (KLOR-CON 10) 10 mEq tablet potassium chloride ER 10 mEq tablet,extended release     ??? pravastatin (PRAVACHOL) 20 mg tablet pravastatin 20 mg tablet     ??? topiramate (TOPAMAX) 25 mg tablet topiramate 25 mg tablet       No current facility-administered medications on file prior to visit.      Results for orders placed or performed in visit on 11/04/17   AMB POC URINALYSIS DIP STICK AUTO W/O MICRO   Result Value Ref Range    Color (UA POC) Yellow     Clarity (UA POC) Clear     Glucose (UA POC) Negative Negative    Bilirubin (UA POC) Negative Negative    Ketones (UA POC) Negative Negative    Specific gravity (UA POC) 1.015 1.001 - 1.035    Blood (UA POC) Negative Negative    pH (UA POC) 5.5 4.6 - 8.0    Protein (UA POC) 3+ Negative    Urobilinogen (UA POC) 0.2 mg/dL 0.2 - 1    Nitrites (UA POC) Negative Negative    Leukocyte esterase (UA POC) Negative Negative     Physical Exam:  Visit Vitals  BP 128/78   Ht 4\' 11"  (1.499 m)   Wt 159 lb (72.1 kg)   BMI 32.11 kg/m??     Constitutional: Well developed, well-nourished BF in no acute distress.   CV:  RRR  Respiratory: No respiratory distress or difficulties  Abdomen:  Soft ND, diffuse abd tenderness. No masses.    GU: Bilat CVAT, diffuse back tenderness  Extremities: No peripheral swelling noted  MSK: FROM  Skin: No bruising or rashes.      Neuro/Psych:  Patient with appropriate  affect.  Alert and oriented x 3.    Lymphatic:   No enlargement of neck / subclavian lymph nodes    Review of Systems  Constitutional: Fever: No  Skin: Rash: No  HEENT: Hearing difficulty: No  Eyes: Blurred vision: No  Cardiovascular: Chest pain: No  Respiratory: Shortness of breath: No  Gastrointestinal: Nausea/vomiting: No  Musculoskeletal: Back pain: Yes  Neurological: Weakness: No  Psychological: Memory loss: No  Comments/additional findings:       Medical documentation provided by Clarise CruzMiles Hamberg, medical  scribe for Etheleen NicksPeter O Daniyal Tabor, MD     Etheleen NicksPeter O Firman Petrow, MD

## 2018-04-13 ENCOUNTER — Encounter: Primary: Family

## 2018-04-13 ENCOUNTER — Encounter

## 2018-04-13 NOTE — Progress Notes (Signed)
Lindsey Yang has order for RUS    To be done at [facility]     Needed by Before April 26, 2018    Patient has a follow-up appointment: YES     Tuesday, April 26, 2018     Best number to contact patient: [phone]  /   [phone]    Order has been placed in connect care YES    Is this a STAT orderNO     Frimy Uffelman

## 2018-04-13 NOTE — Progress Notes (Signed)
[name] has order for RUS    [facility]     Needed by Before April 26, 2018    Patient has a follow-up appointment: YES     Tuesday, April 26, 2018     Best number to contact patient: [phone]  /   [phone]) [phone]    Order has been placed in connect care YES    Is this a STAT orderNO     Glory Buffiane Moore

## 2018-04-14 NOTE — Progress Notes (Signed)
Faxed imaging to [facility] [phone]; RUS; NOW; 04/26/2018 F/U; NO PRIOR AUTH REQUIRED

## 2018-04-14 NOTE — Progress Notes (Signed)
[facility]; 04/21/2018 @ 4:30 pm

## 2018-04-14 NOTE — Progress Notes (Signed)
Faxed imaging to [facility] [phone]; RUS; NOW; 04/26/2018 F/U; NO PRIOR AUTH REQUIRED

## 2018-04-14 NOTE — Progress Notes (Signed)
Patient was scheduled for RUS @  [facility]; 04/21/2018 @ 4:30 pm

## 2018-04-25 NOTE — Progress Notes (Signed)
Patient rescheduled RUS 05/02/2018 @ [facility]

## 2018-04-25 NOTE — Progress Notes (Signed)
[facility]

## 2018-04-26 ENCOUNTER — Encounter: Attending: Urology | Primary: Family

## 2018-05-10 NOTE — Progress Notes (Signed)
Patient had US Appointment scheduled 12/23 but she r.s for 06/01/2018 at [facility].

## 2018-05-10 NOTE — Progress Notes (Signed)
Patient had US Appointment scheduled 12/23 but [facility]

## 2018-06-07 ENCOUNTER — Encounter: Attending: Urology | Primary: Family

## 2018-06-08 ENCOUNTER — Encounter: Attending: Urology | Primary: Family

## 2018-06-08 ENCOUNTER — Ambulatory Visit: Admit: 2018-06-08 | Discharge: 2018-06-08 | Payer: MEDICARE | Attending: Urology | Primary: Family

## 2018-06-08 ENCOUNTER — Ambulatory Visit: Attending: Urology | Primary: Family

## 2018-06-08 DIAGNOSIS — N281 Cyst of kidney, acquired: Secondary | ICD-10-CM

## 2018-06-08 LAB — AMB POC URINALYSIS DIP STICK AUTO W/O MICRO
Bilirubin (UA POC): NEGATIVE
Bilirubin, Urine, POC: NEGATIVE
Blood (UA POC): NEGATIVE
Blood (UA POC): NEGATIVE
Glucose (UA POC): NEGATIVE
Glucose, Urine, POC: NEGATIVE
Ketones (UA POC): NEGATIVE
Ketones, Urine, POC: NEGATIVE
Leukocyte Esterase, Urine, POC: NEGATIVE
Leukocyte esterase (UA POC): NEGATIVE
Nitrite, Urine, POC: NEGATIVE
Nitrites (UA POC): NEGATIVE
Specific Gravity, Urine, POC: 1.01 NA (ref 1.001–1.035)
Specific gravity (UA POC): 1.01 (ref 1.001–1.035)
Urobilinogen (UA POC): 0.2 (ref 0.2–1)
Urobilinogen, POC: 0.2 (ref 0.2–1)
pH (UA POC): 5.5 (ref 4.6–8.0)
pH, Urine, POC: 5.5 NA (ref 4.6–8.0)

## 2018-06-08 NOTE — Progress Notes (Signed)
06/08/2018     ASSESSMENT:  1. Right lower pole renal nodule 7mm w/ dilation of lower pole calyces by 09/20/17 MRI abd    -- No renal masses by 05/31/2018 complete Abdominal US   -- 11/04/2017, 06/08/2018 UA no blood  2. Right peripelvic renal cyst 2 cm by 05/31/2018 Abdominal US  -- pt reassured, no intervention needed  3. Right flank pain, resolved -- ?etiology -- not due to Kidney nodule or cysts    PLAN:    1. Reviewed 05/31/2018 complete Abdominal US, No renal masses -- no GU intervention needed    RTC PRN    Patient's BMI is out of the normal parameters.  Information about BMI was given to the patient.  Patient to f/u w/ PCP for further management of this issue if needed.     CC:  Renal mass    Ted McalpineCynthia E Isenberg is a 68 y.o. black female here for f/u of a right renal mass.    Previously seen by Dr. Silvestre MomentFabrizio for this issues on 10/18/17.   05/31/2018 complete Abdominal US at [facility] showed Bilateral medical renal disease and Right peripelvic renal cyst 2 cm.  No renal masses.     >> Doing well, no new urologic issues.    Denies any flank/abd pains. No voiding difficulty, incontinence.  Nov 2019 fell and hit the door on her left side -- Still w/ left arm pain.    Has chronic knee pains d/t arthritis.  Using a Rollator.   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     05/31/2018 complete Abdominal US at [facility]  1. No acute findings.  2. No splenomegaly.  3. Bilateral medical renal disease.  Right peripelvic renal cyst 2 cm.  4. Status post cholecystectomy. ??Presumed compensatory bile duct  prominence.      10/21/17 MRI abd [facility])  IMPRESSION  1. Right lower pole renal cortical nodule 7mm. Difficult to further evaluate w/o contrast. Proteinaceous cyst, angiomyolipoma as well as a  solid mass are possible. Need comparison with previous exam and cross-sectional imaging with contrast.   2. Marked dilation right lower pole ranel pelvis.   3. Bilateral additional renal lesions, too small to characterize but likely small cysts.     PMH/PSH/SocHx/FamHx/Meds & allergies reviewed.   Past Medical History:   Diagnosis Date   ??? Abnormal urine finding    ??? Cigarette nicotine dependence    ??? CKD (chronic kidney disease), stage III (HCC)    ??? Colon polyp    ??? COPD (chronic obstructive pulmonary disease) (HCC)    ??? Fall 05/2018   ??? GERD (gastroesophageal reflux disease)    ??? Gout    ??? Hyperglycemia    ??? Mixed hyperlipidemia    ??? Right renal mass    ??? Vitamin D deficiency      Past Surgical History:   Procedure Laterality Date   ??? HX CATARACT REMOVAL     ??? HX CHOLECYSTECTOMY     ??? HX COLONOSCOPY     ??? HX HYSTERECTOMY     ??? HX OVARIAN CYST REMOVAL     ??? HX TONSILLECTOMY       Social History  Socioeconomic History   ??? Marital status: WIDOWED     Spouse name: Not on file   ??? Number of children: Not on file   ??? Years of education: Not on file   ??? Highest education level: Not on file   Occupational History   ??? Not on file   Social Needs   ??? Financial resource strain: Not on file   ??? Food insecurity:     Worry: Not on file     Inability: Not on file   ??? Transportation needs:     Medical: Not on file     Non-medical: Not on file   Tobacco Use   ??? Smoking status: Former Smoker   ??? Smokeless tobacco: Never Used   Substance and Sexual Activity   ??? Alcohol use: Not Currently   ??? Drug use: Never   ??? Sexual activity: Not on file   Lifestyle   ??? Physical activity:     Days per week: Not on file     Minutes per session: Not on file   ??? Stress: Not on file   Relationships   ??? Social connections:     Talks on phone: Not on file     Gets together: Not on file     Attends religious service: Not on file     Active member of club or organization: Not on file     Attends meetings of clubs or organizations: Not on file      Relationship status: Not on file   ??? Intimate partner violence:     Fear of current or ex partner: Not on file     Emotionally abused: Not on file     Physically abused: Not on file     Forced sexual activity: Not on file   Other Topics Concern   ??? Not on file   Social History Narrative   ??? Not on file     Family History   Problem Relation Age of Onset   ??? Diabetes Sister    ??? Breast Cancer Sister    ??? Other Sister         Gastric ulcer   ??? Diabetes Sister    ??? Breast Cancer Sister      Allergies   Allergen Reactions   ??? Iodinated Contrast Media Other (comments)     Bumps all over skin; was given Epi-pen by MD and told not to ever take it again   ??? Phenobarbital Anaphylaxis and Other (comments)   ??? Shellfish Containing Products Hives and Other (comments)     Bumps all over her skin. Was given an EPI pen by her MD and told to never eat shellfish again.     ??? Glucosamine Hives   ??? Ibuprofen Other (comments) and Unknown (comments)     GI upset     ??? Lamotrigine Other (comments)     Scratchy throat   Scratchy throat   Scratchy throat   Scratchy throat   Scratchy throat   Scratchy throat   Scratchy throat   Scratchy throat   Scratchy throat  Scratchy throat   Scratchy throat   Scratchy throat      ??? Metrizamide Hives   ??? Penicillins Hives   ??? Pregabalin Other (comments)     Rash, hives  Rash, hives     ??? Propoxyphene N-Acetaminophen Other (comments)     GI upset   ??? Sulfamethoxazole-Trimethoprim Other (comments)     Other reaction(s): GI  intolerance  Other reaction(s): GI intolerance     ??? Ace Inhibitors Itching     Angioedema   Angioedema   Angioedema   Angioedema   Angioedema   Angioedema   Angioedema   Angioedema   Angioedema   Angioedema   Angioedema        Current Outpatient Medications on File Prior to Visit   Medication Sig Dispense Refill   ??? gabapentin (NEURONTIN) 100 mg capsule Take 200 mg by mouth four (4) times daily.     ??? allopurinol (ZYLOPRIM) 300 mg tablet allopurinol 300 mg tablet      ??? amLODIPine (NORVASC) 10 mg tablet amlodipine 10 mg tablet     ??? cetirizine (ZYRTEC) 10 mg tablet TAKE ONE TABLET EVERY DAY  12   ??? clonazePAM (KLONOPIN) 1 mg tablet Patient takes 2 tablets at bedtime  3   ??? dicyclomine (BENTYL) 20 mg tablet Take 1 tablet (20 mg total) by mouth 2 (two) times a day.  2   ??? DULoxetine (CYMBALTA) 60 mg capsule duloxetine 60 mg capsule,delayed release     ??? metoprolol succinate (TOPROL-XL) 100 mg tablet metoprolol succinate ER 100 mg tablet,extended release 24 hr     ??? potassium chloride SR (KLOR-CON 10) 10 mEq tablet potassium chloride ER 10 mEq tablet,extended release     ??? pravastatin (PRAVACHOL) 20 mg tablet pravastatin 20 mg tablet     ??? albuterol (PROAIR HFA) 90 mcg/actuation inhaler ProAir HFA 90 mcg/actuation aerosol inhaler     ??? naloxone (NARCAN) 4 mg/actuation nasal spray Narcan 4 mg/actuation nasal spray     ??? oxyCODONE-acetaminophen (PERCOCET 10) 10-325 mg per tablet oxycodone-acetaminophen 10 mg-325 mg tablet   Take 1 tablet 3 times a day by oral route.       No current facility-administered medications on file prior to visit.        Urinalysis:   Results for orders placed or performed in visit on 06/08/18   AMB POC URINALYSIS DIP STICK AUTO W/O MICRO   Result Value Ref Range    Color (UA POC) Yellow     Clarity (UA POC) Clear     Glucose (UA POC) Negative Negative    Bilirubin (UA POC) Negative Negative    Ketones (UA POC) Negative Negative    Specific gravity (UA POC) 1.010 1.001 - 1.035    Blood (UA POC) Negative Negative    pH (UA POC) 5.5 4.6 - 8.0    Protein (UA POC) 2+ Negative    Urobilinogen (UA POC) 0.2 mg/dL 0.2 - 1    Nitrites (UA POC) Negative Negative    Leukocyte esterase (UA POC) Negative Negative     Physical Exam:  Visit Vitals  BP 130/70   Ht 4\' 11"  (1.499 m)   Wt 162 lb (73.5 kg)   BMI 32.72 kg/m??     Constitutional: Well developed, well-nourished BF in no acute distress.   CV:  RRR  Respiratory: No respiratory distress or difficulties   Abdomen:  Soft NDNT. No masses.    GU: No CVAT  Extremities: No peripheral swelling noted  MSK: FROM  Skin: No jaundice      Neuro/Psych:  Patient with appropriate affect.  Alert and oriented x 3.        Review of Systems  Constitutional: Fever: No  Skin: Rash: No  HEENT: Hearing difficulty: No  Eyes: Blurred vision: No  Cardiovascular: Chest pain: Yes  Respiratory: Shortness of breath: No  Gastrointestinal: Nausea/vomiting: No  Musculoskeletal: Back pain: No  Neurological: Weakness: No  Psychological: Memory loss: Yes  Comments/additional findings:       Etheleen Nicks, MD     Cc:   Dartha Lodge Jonell Cluck, NP

## 2018-06-08 NOTE — Progress Notes (Signed)
06/08/2018     ASSESSMENT:  1. Right lower pole renal nodule 85mm w/ dilation of lower pole calyces by 09/20/17 MRI abd    -- No renal masses by 05/31/2018 complete Abdominal US   -- 11/04/2017, 06/08/2018 UA no blood  2. Right peripelvic renal cyst 2 cm by 05/31/2018 Abdominal US  -- pt reassured, no intervention needed  3. Right flank pain, resolved -- ?etiology -- not due to Kidney nodule or cysts    PLAN:    1. Reviewed 05/31/2018 complete Abdominal US, No renal masses -- no GU intervention needed    RTC PRN    Patient's BMI is out of the normal parameters.  Information about BMI was given to the patient.  Patient to f/u w/ PCP for further management of this issue if needed.     CC:  Renal mass    Lindsey Yang is a 68 y.o. black female here for f/u of a right renal mass.    Previously seen by Dr. Silvestre Moment for this issues on 10/18/17.   05/31/2018 complete Abdominal US at [facility] showed Bilateral medical renal disease and Right peripelvic renal cyst 2 cm.  No renal masses.     >> Doing well, no new urologic issues.    Denies any flank/abd pains. No voiding difficulty, incontinence.  Nov 2019 fell and hit the door on her left side -- Still w/ left arm pain.    Has chronic knee pains d/t arthritis.  Using a Rollator.   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     05/31/2018 complete Abdominal US at [facility]  1. No acute findings.  2. No splenomegaly.  3. Bilateral medical renal disease.  Right peripelvic renal cyst 2 cm.  4. Status post cholecystectomy. ??Presumed compensatory bile duct  prominence.      10/21/17 MRI abd [facility])  IMPRESSION  1. Right lower pole renal cortical nodule 51mm. Difficult to further evaluate w/o contrast. Proteinaceous cyst, angiomyolipoma as well as a solid  mass are possible. Need comparison with previous exam and cross-sectional imaging with contrast.   2. Marked dilation right lower pole ranel pelvis.   3. Bilateral additional renal lesions, too small to characterize but likely small cysts.     PMH/PSH/SocHx/FamHx/Meds & allergies reviewed.   Past Medical History:   Diagnosis Date   ??? Abnormal urine finding    ??? Cigarette nicotine dependence    ??? CKD (chronic kidney disease), stage III (HCC)    ??? Colon polyp    ??? COPD (chronic obstructive pulmonary disease) (HCC)    ??? Fall 05/2018   ??? GERD (gastroesophageal reflux disease)    ??? Gout    ??? Hyperglycemia    ??? Mixed hyperlipidemia    ??? Right renal mass    ??? Vitamin D deficiency      Past Surgical History:   Procedure Laterality Date   ??? HX CATARACT REMOVAL     ??? HX CHOLECYSTECTOMY     ??? HX COLONOSCOPY     ??? HX HYSTERECTOMY     ??? HX OVARIAN CYST REMOVAL     ??? HX TONSILLECTOMY       Social History  Socioeconomic History   ??? Marital status: WIDOWED     Spouse name: Not on file   ??? Number of children: Not on file   ??? Years of education: Not on file   ??? Highest education level: Not on file   Occupational History   ??? Not on file   Social Needs   ??? Financial resource strain: Not on file   ??? Food insecurity:     Worry: Not on file     Inability: Not on file   ??? Transportation needs:     Medical: Not on file     Non-medical: Not on file   Tobacco Use   ??? Smoking status: Former Smoker   ??? Smokeless tobacco: Never Used   Substance and Sexual Activity   ??? Alcohol use: Not Currently   ??? Drug use: Never   ??? Sexual activity: Not on file   Lifestyle   ??? Physical activity:     Days per week: Not on file     Minutes per session: Not on file   ??? Stress: Not on file   Relationships   ??? Social connections:     Talks on phone: Not on file     Gets together: Not on file     Attends religious service: Not on file     Active member of club or organization: Not on file     Attends meetings of clubs or organizations: Not on file      Relationship status: Not on file   ??? Intimate partner violence:     Fear of current or ex partner: Not on file     Emotionally abused: Not on file     Physically abused: Not on file     Forced sexual activity: Not on file   Other Topics Concern   ??? Not on file   Social History Narrative   ??? Not on file     Family History   Problem Relation Age of Onset   ??? Diabetes Sister    ??? Breast Cancer Sister    ??? Other Sister         Gastric ulcer   ??? Diabetes Sister    ??? Breast Cancer Sister      Allergies   Allergen Reactions   ??? Iodinated Contrast Media Other (comments)     Bumps all over skin; was given Epi-pen by MD and told not to ever take it again   ??? Phenobarbital Anaphylaxis and Other (comments)   ??? Shellfish Containing Products Hives and Other (comments)     Bumps all over her skin. Was given an EPI pen by her MD and told to never eat shellfish again.     ??? Glucosamine Hives   ??? Ibuprofen Other (comments) and Unknown (comments)     GI upset     ??? Lamotrigine Other (comments)     Scratchy throat   Scratchy throat   Scratchy throat   Scratchy throat   Scratchy throat   Scratchy throat   Scratchy throat   Scratchy throat   Scratchy throat  Scratchy throat   Scratchy throat   Scratchy throat      ??? Metrizamide Hives   ??? Penicillins Hives   ??? Pregabalin Other (comments)     Rash, hives  Rash, hives     ??? Propoxyphene N-Acetaminophen Other (comments)     GI upset   ??? Sulfamethoxazole-Trimethoprim Other (comments)     Other reaction(s): GI  intolerance  Other reaction(s): GI intolerance     ??? Ace Inhibitors Itching     Angioedema   Angioedema   Angioedema   Angioedema   Angioedema   Angioedema   Angioedema   Angioedema   Angioedema   Angioedema   Angioedema        Current Outpatient Medications on File Prior to Visit   Medication Sig Dispense Refill   ??? gabapentin (NEURONTIN) 100 mg capsule Take 200 mg by mouth four (4) times daily.     ??? allopurinol (ZYLOPRIM) 300 mg tablet allopurinol 300 mg tablet     ??? amLODIPine  (NORVASC) 10 mg tablet amlodipine 10 mg tablet     ??? cetirizine (ZYRTEC) 10 mg tablet TAKE ONE TABLET EVERY DAY  12   ??? clonazePAM (KLONOPIN) 1 mg tablet Patient takes 2 tablets at bedtime  3   ??? dicyclomine (BENTYL) 20 mg tablet Take 1 tablet (20 mg total) by mouth 2 (two) times a day.  2   ??? DULoxetine (CYMBALTA) 60 mg capsule duloxetine 60 mg capsule,delayed release     ??? metoprolol succinate (TOPROL-XL) 100 mg tablet metoprolol succinate ER 100 mg tablet,extended release 24 hr     ??? potassium chloride SR (KLOR-CON 10) 10 mEq tablet potassium chloride ER 10 mEq tablet,extended release     ??? pravastatin (PRAVACHOL) 20 mg tablet pravastatin 20 mg tablet     ??? albuterol (PROAIR HFA) 90 mcg/actuation inhaler ProAir HFA 90 mcg/actuation aerosol inhaler     ??? naloxone (NARCAN) 4 mg/actuation nasal spray Narcan 4 mg/actuation nasal spray     ??? oxyCODONE-acetaminophen (PERCOCET 10) 10-325 mg per tablet oxycodone-acetaminophen 10 mg-325 mg tablet   Take 1 tablet 3 times a day by oral route.       No current facility-administered medications on file prior to visit.        Urinalysis:   Results for orders placed or performed in visit on 06/08/18   AMB POC URINALYSIS DIP STICK AUTO W/O MICRO   Result Value Ref Range    Color (UA POC) Yellow     Clarity (UA POC) Clear     Glucose (UA POC) Negative Negative    Bilirubin (UA POC) Negative Negative    Ketones (UA POC) Negative Negative    Specific gravity (UA POC) 1.010 1.001 - 1.035    Blood (UA POC) Negative Negative    pH (UA POC) 5.5 4.6 - 8.0    Protein (UA POC) 2+ Negative    Urobilinogen (UA POC) 0.2 mg/dL 0.2 - 1    Nitrites (UA POC) Negative Negative    Leukocyte esterase (UA POC) Negative Negative     Physical Exam:  Visit Vitals  BP 130/70   Ht 4\' 11"  (1.499 m)   Wt 162 lb (73.5 kg)   BMI 32.72 kg/m??     Constitutional: Well developed, well-nourished BF in no acute distress.   CV:  RRR  Respiratory: No respiratory distress or difficulties  Abdomen:  Soft NDNT. No  masses.    GU: No CVAT  Extremities: No peripheral swelling noted  MSK: FROM  Skin: No jaundice      Neuro/Psych:  Patient with appropriate affect.  Alert and oriented x 3.        Review of Systems  Constitutional: Fever: No  Skin: Rash: No  HEENT: Hearing difficulty: No  Eyes: Blurred vision: No  Cardiovascular: Chest pain: Yes  Respiratory: Shortness of breath: No  Gastrointestinal: Nausea/vomiting: No  Musculoskeletal: Back pain: No  Neurological: Weakness: No  Psychological: Memory loss: Yes  Comments/additional findings:       Etheleen Nicks, MD     Cc:   Dartha Lodge Jonell Cluck, NP

## 2018-06-21 NOTE — Progress Notes (Signed)
Formatting of this note is different from the original.  CC:   Chief Complaint   Patient presents with   ? Abdominal Pain     Patient states having lower abdominal pain with nausea, diarrhea.       HPI: the patient is a 68 y.o. female here for follow up abdominal pain, diarrhea, and fecal incontinence at times. She has a history of IBS-D, she takes Bentyl 20 mg BID and Imodium daily which helps, but she continues to have episodes of diarrhea. She denies any nausea or vomiting. No constipation, blood in stool or melena. Her appetite is good, weight is stable. She takes oxycodone for joint pain. Last colonoscopy was on 03/07/18 which showed external hemorrhoids and diverticulosis, otherwise normal.     Medication Sig   ? albuterol HFA (PROVENTIL HFA;VENTOLIN HFA) 90 mcg/actuation inhaler Inhale 2 puffs every 4 (four) hours as needed     ? allopurinol (ZYLOPRIM) 300 mg tablet 100 mg 2 (two) times a day     ? amLODIPine (NORVASC) 5 mg tablet Take 5 mg by mouth once a day     ? clonazePAM (KlonoPIN) 1 mg tablet Take 1 mg by mouth 2 (two) times a day Patient takes 1/2 tablet in morning and at 2pm and a whole tablet at bedtime.     ? dicyclomine (BENTYL) 20 mg tablet Take 1 tablet (20 mg total) by mouth 2 (two) times a day   ? DULoxetine (CYMBALTA) 60 mg capsule Take 60 mg by mouth once a day     ? EPINEPHrine (EPIPEN 2-PAK) 0.3 mg/0.3 mL injection syringe Inject as directed. As directed    ? gabapentin (NEURONTIN) 300 mg capsule Take 600 mg by mouth 4 (four) times a day     ? metoprolol tartrate (LOPRESSOR) 100 mg tablet Take 100 mg by mouth 2 (two) times a day   ? oxyCODONE-acetaminophen (PERCOCET) 10-325 mg per tablet Take 1 tablet by mouth 3 (three) times a day as needed   ? pravastatin (PRAVACHOL) 20 mg tablet Take 20 mg by mouth once a day     ? sodium, potassium, and magnesium sulfates (SUPREP BOWEL PREP KIT) 17.5-3.13-1.6 gram recon soln Take as directed by physician     Review of Systems - See HPI  The remaining  systems are normal    Vitals:    06/21/18 1020   BP: 124/82   Pulse: 88   Resp: 16   Temp: 36.8 C     Body mass index is 33.57 kg/m.    Alert and oriented to place and time  HEENT: negative  Chest: clear to A & P  Heart: no murmurs, RRR  Abdomen: soft, none tender, no mass. No fluids.  Extremities: No edema, no gait disturbances.      Diagnosis Plan   1. Irritable bowel syndrome with diarrhea  Bentyl 20 mg TID   Continue Imodium daily- she can take up to 3 pills daily.  F/U in 1 year or sooner if symptoms worsen   2. Incontinence of feces with fecal urgency(occasional)        By signing my name below, I, Jonny Ruiz, attest that this documentation has been prepared under the direction and in the presence of Maylon Peppers, MD  Electronically signed: Jonny Ruiz, Scribe 07/15/18  10:35 AM     The scribe's documentation has been prepared under my direction and personally reviewed by me.  I confirm that the scribe's documentation accurately reflects all work,  treatment, procedures, and medical decision making performed by me.  Bernerd Limbo, MD    Electronically signed by Maylon Peppers, MD at 07/15/2018 10:36 AM EST

## 2019-02-14 NOTE — Telephone Encounter (Signed)
Patient called in stating that she is experiencing some pain around the waist and back. I informed pt that I am unable to get her scheduled as those symptoms can vary. I also informed pt that she will need to be seen by her PCP so that we could know exactly what is going on to get her scheduled for the correct diagnoses.

## 2021-05-06 ENCOUNTER — Encounter (HOSPITAL_COMMUNITY): Payer: Self-pay | Admitting: Emergency Medicine

## 2021-05-06 ENCOUNTER — Ambulatory Visit (HOSPITAL_COMMUNITY)
Admission: EM | Admit: 2021-05-06 | Discharge: 2021-05-06 | Disposition: A | Discharge status: Home / Self Care | Payer: 59 | Attending: Family Medicine | Admitting: Family Medicine

## 2021-05-06 ENCOUNTER — Other Ambulatory Visit: Payer: Self-pay

## 2021-05-06 DIAGNOSIS — R600 Localized edema: Secondary | ICD-10-CM | POA: Insufficient documentation

## 2021-05-06 LAB — COMPREHENSIVE METABOLIC PANEL
ALT: 12 U/L (ref 0–44)
AST: 19 U/L (ref 15–41)
Albumin: 2.9 g/dL — ABNORMAL LOW (ref 3.5–5.0)
Alkaline Phosphatase: 139 U/L — ABNORMAL HIGH (ref 38–126)
Anion gap: 13 (ref 5–15)
BUN: 36 mg/dL — ABNORMAL HIGH (ref 8–23)
CO2: 26 mmol/L (ref 22–32)
Calcium: 8.9 mg/dL (ref 8.9–10.3)
Chloride: 103 mmol/L (ref 98–111)
Creatinine, Ser: 2.28 mg/dL — ABNORMAL HIGH (ref 0.44–1.00)
GFR, Estimated: 22 mL/min — ABNORMAL LOW (ref >60)
Glucose, Bld: 99 mg/dL (ref 70–99)
Potassium: 3.8 mmol/L (ref 3.5–5.1)
Sodium: 142 mmol/L (ref 135–145)
Total Bilirubin: 1 mg/dL (ref 0.3–1.2)
Total Protein: 6.3 g/dL — ABNORMAL LOW (ref 6.5–8.1)

## 2021-05-06 MED ORDER — FLUTICASONE-SALMETEROL 100-50 MCG/ACT IN AEPB: 1.0000 | INHALATION_SPRAY | Freq: Two times a day (BID) | RESPIRATORY_TRACT | 0 refills | Status: AC

## 2021-05-06 MED ORDER — ALBUTEROL SULFATE HFA 108 (90 BASE) MCG/ACT IN AERS: 2.0000 | INHALATION_SPRAY | Freq: Four times a day (QID) | RESPIRATORY_TRACT | 0 refills | Status: AC | PRN

## 2021-05-06 MED ORDER — CLONAZEPAM 1 MG PO TABS: 1.5000 mg | ORAL_TABLET | Freq: Every evening | ORAL | 0 refills | Status: AC | PRN

## 2021-05-06 MED ORDER — DULOXETINE HCL 60 MG PO CPEP: 60.0000 mg | ORAL_CAPSULE | Freq: Every day | ORAL | 0 refills | Status: AC

## 2021-05-06 NOTE — ED Triage Notes (Signed)
Pt reports bilat lower extremity swelling and pain for couple weeks. Pt also c/o back and neck pains for "while", repotrs out of her pain meds. Reports tingling In bilat hands for week.

## 2021-05-06 NOTE — ED Provider Notes (Addendum)
[place]    CSN: [id number] Arrival date & time: 05/06/21  1508      History   Chief Complaint Chief Complaint  Patient presents with   Leg Swelling    HPI Tracy Calhoun is a 70 y.o. female.   HPI Here with swelling in her legs for the last month, at first intermittent, and would improve with elevation and after sleep, but now more persistent. Has never had leg swelling before.  She also notes some diarrhea since 12/19. No f/c. Did have a cough 2 days ago, now resolved.  Is visiting here, has been here about 2 weeks with family, and states has run out of several of her meds.   Past Medical History:  Diagnosis Date   Arthritis    Chronic pain syndrome    Depression with anxiety    GERD (gastroesophageal reflux disease)    Gout    Hyperglycemia    Hypertension    IBS (irritable bowel syndrome)    Insomnia    Personality disorder ([place])    Sciatica    Vitamin D deficiency     Patient Active Problem List   Diagnosis Date Noted   Essential hypertension 02/08/2014   Hypoxia 02/07/2014    Past Surgical History:  Procedure Laterality Date   ABDOMINAL HYSTERECTOMY     BREAST LUMPECTOMY     SHOULDER SURGERY      OB History   No obstetric history on file.      Home Medications    Prior to Admission medications   Medication Sig Start Date End Date Taking? Authorizing Provider  fluticasone-salmeterol (ADVAIR DISKUS) 100-50 MCG/ACT AEPB Inhale 1 puff into the lungs 2 (two) times daily. 05/06/21  Yes Barrett Henle, MD  albuterol (VENTOLIN HFA) 108 (90 Base) MCG/ACT inhaler Inhale 2 puffs into the lungs every 6 (six) hours as needed for wheezing or shortness of breath. 05/06/21   Barrett Henle, MD  allopurinol (ZYLOPRIM) 300 MG tablet Take 300 mg by mouth daily.    [provider]  amLODipine (NORVASC) 5 MG tablet Take 5 mg by mouth daily.    [provider]  cholestyramine Lucrezia Starch) 4 G packet Take 4 g by mouth 3  (three) times daily with meals.    [provider]  clonazePAM (KLONOPIN) 1 MG tablet Take 1.5 tablets (1.5 mg total) by mouth at bedtime as needed for anxiety. 05/06/21   Barrett Henle, MD  DULoxetine (CYMBALTA) 60 MG capsule Take 1 capsule (60 mg total) by mouth daily. 05/06/21   Barrett Henle, MD  ergocalciferol (VITAMIN D2) 50000 UNITS capsule Take 50,000 Units by mouth once a week. Monday    [provider]  esomeprazole (NEXIUM) 40 MG capsule Take 40 mg by mouth daily at 12 noon.    [provider]  gabapentin (NEURONTIN) 300 MG capsule Take 300 mg by mouth 3 (three) times daily.    [provider]  hydrOXYzine (ATARAX/VISTARIL) 25 MG tablet Take 25 mg by mouth 3 (three) times daily as needed.    [provider]  meclizine (ANTIVERT) 25 MG tablet Take 25 mg by mouth 3 (three) times daily as needed for dizziness.    [provider]  metoprolol succinate (TOPROL-XL) 50 MG 24 hr tablet Take 50 mg by mouth daily. Take with or immediately following a meal.    [provider]  ondansetron (ZOFRAN) 4 MG tablet Take 1 tablet (4 mg total) by mouth every  6 (six) hours. 02/09/14   Katheren Shams, DO  oxyCODONE-acetaminophen (PERCOCET) 7.5-325 MG per tablet Take 1 tablet by mouth every 4 (four) hours as needed for pain.    [provider]  oxyCODONE-acetaminophen (PERCOCET/ROXICET) 5-325 MG per tablet Take 1 tablet by mouth every 6 (six) hours as needed for moderate pain. 02/09/14   Katheren Shams, DO  polyethylene glycol (MIRALAX / GLYCOLAX) packet Take 17 g by mouth daily.    [provider]  pravastatin (PRAVACHOL) 20 MG tablet Take 20 mg by mouth daily.    [provider]  topiramate (TOPAMAX) 25 MG tablet Take 100 mg by mouth daily.    [provider]    Family History No family history on file.  Social History Social History   Tobacco Use   Smoking status: Every Day    Packs/day: 0.50     Types: Cigarettes  Substance Use Topics   Alcohol use: No   Drug use: No     Allergies   Phenobarbital, Shellfish allergy, Ace inhibitors, Bactrim [sulfamethoxazole-trimethoprim], Ibuprofen, Ivp dye [iodinated contrast media], Lamictal [lamotrigine], Penicillins, and Latex   Review of Systems Review of Systems   Physical Exam Triage Vital Signs ED Triage Vitals [05/06/21 1613]  Enc Vitals Group     BP (!) 174/111     Pulse Rate 99     Resp 19     Temp 98.2 F (36.8 C)     Temp Source Oral     SpO2 (!) 85 %     Weight      Height      Head Circumference      Peak Flow      Pain Score      Pain Loc      Pain Edu?      Excl. in [place]?    No data found.  Updated Vital Signs BP (!) 175/95 (BP Location: Right Arm)    Pulse 86    Temp 98.2 F (36.8 C) (Oral)    Resp 20    SpO2 94%   Visual Acuity Right Eye Distance:   Left Eye Distance:   Bilateral Distance:    Right Eye Near:   Left Eye Near:    Bilateral Near:     Physical Exam Vitals reviewed.  HENT:     Mouth/Throat:     Mouth: Mucous membranes are moist.     Pharynx: No oropharyngeal exudate or posterior oropharyngeal erythema.  Eyes:     Extraocular Movements: Extraocular movements intact.     Pupils: Pupils are equal, round, and reactive to light.  Cardiovascular:     Rate and Rhythm: Normal rate and regular rhythm.     Heart sounds: No murmur heard. Pulmonary:     Effort: No respiratory distress.     Breath sounds: No wheezing, rhonchi or rales.  Musculoskeletal:     Cervical back: Neck supple.     Comments: Has edema in both lower legs, left more than right. No erythema or calf tenderness  Lymphadenopathy:     Cervical: No cervical adenopathy.  Skin:    Coloration: Skin is not jaundiced or pale.  Neurological:     General: No focal deficit present.     Mental Status: She is alert and oriented to person, place, and time.  Psychiatric:        Behavior: Behavior normal.     UC Treatments  / Results  Labs (all labs ordered are listed, but  only abnormal results are displayed) Labs Reviewed  COMPREHENSIVE METABOLIC PANEL - Abnormal; Notable for the following components:      Result Value   BUN 36 (*)    Creatinine, Ser 2.28 (*)    Total Protein 6.3 (*)    Albumin 2.9 (*)    Alkaline Phosphatase 139 (*)    GFR, Estimated 22 (*)    All other components within normal limits    EKG   Radiology No results found.  Procedures Procedures (including critical care time)  Medications Ordered in UC Medications - No data to display  Initial Impression / Assessment and Plan / UC Course  I have reviewed the triage vital signs and the nursing notes.  Pertinent labs & imaging results that were available during my care of the patient were reviewed by me and considered in my medical decision making (see chart for details).     Discussed I cannot duplicate her percocet rx, as she should not be out, when she asked about pain meds. She last filled the clonazepam in [place] 11/8, so I sent in 10 pills of that.  Refills done of her albuterol, advair, and cymbalta. Her pcp has sent in lasix and then torsemide for her; discussed compression stockings are better. She states she left those in [place], her home.  Initial O2 sat was 85% on RA, pt on home o2 at home. She has not taken her bp meds today either Final Clinical Impressions(s) / UC Diagnoses   Final diagnoses:  Leg edema     Discharge Instructions      See your primary doctor soon. Use compression stockings for the leg swelling, instead of taking diuretics.     ED Prescriptions     Medication Sig Dispense Auth. Provider   albuterol (VENTOLIN HFA) 108 (90 Base) MCG/ACT inhaler Inhale 2 puffs into the lungs every 6 (six) hours as needed for wheezing or shortness of breath. 1 each Barrett Henle, MD   fluticasone-salmeterol (ADVAIR DISKUS) 100-50 MCG/ACT AEPB Inhale 1 puff into the lungs 2 (two) times daily. 60 each  Barrett Henle, MD   DULoxetine (CYMBALTA) 60 MG capsule Take 1 capsule (60 mg total) by mouth daily. 30 capsule Barrett Henle, MD   clonazePAM (KLONOPIN) 1 MG tablet Take 1.5 tablets (1.5 mg total) by mouth at bedtime as needed for anxiety. 10 tablet Windy Carina Gwenlyn Perking, MD      I have reviewed the PDMP during this encounter.   Barrett Henle, MD 05/06/21 1726    Barrett Henle, MD 05/08/21 [id number]

## 2021-05-06 NOTE — ED Notes (Signed)
Pt in room comfortable. O2 saturation 95% on 3L oxygen via nasal canula.

## 2021-05-06 NOTE — ED Notes (Signed)
Dr Windy Carina made aware of pt O2 saturation 85%. Pt placed on 3L oxygen via nasal canula.

## 2021-05-06 NOTE — Discharge Instructions (Addendum)
See your primary doctor soon. Use compression stockings for the leg swelling, instead of taking diuretics.

## 2021-06-20 LAB — HEMOGLOBIN A1C: Hemoglobin A1C, External: 6.5 % — ABNORMAL HIGH (ref 0.0–5.7)

## 2021-06-20 NOTE — Unmapped (Signed)
Formatting of this note might be different from the original.  Goals: improved mental status      Identify possible barriers to meeting goals/advancing plan of care: polypharmacy at home, copd    Stability of the patient: Moderately Unstable - Medium risk of patient condition declining or worsening    End of Shift Summary: patient awakened at 10am, oriented when awake, still dozing off frequently    Electronically signed by Renne Crigler, RN at 06/20/2021  1:35 PM EST

## 2021-06-20 NOTE — ED Provider Notes (Signed)
Formatting of this note is different from the original.  HPI     Chief Complaint   Patient presents with   ? Headache   ? Abdominal Pain     This patient is a 71 year old female here with a headache.  Patient reports it has been going on, worsening for the past 2 weeks.  Patient reports that it got worse tonight however, so she dial 911 to come to the emergency department for additional evaluation.  Patient has not taken anything other than her Percocet for headache.  Patient reports that the headache is right-sided, and radiates throughout her entire body.  Patient denies any fevers, chills, chest pain, shortness of breath, dizziness, vertigo, syncope, seizures, altered mental status.  Patient denies weakness, numbness, tingling.     Patient History     Past Medical History:   Diagnosis Date   ? Arthritis    ? Chronic kidney disease, stage III (moderate) (CMS/HCC) 04/27/2013   ? Chronic pain syndrome    ? 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 Chronic respiratory failure (CMS/HCC) 09/30/2016   ? Chronic, continuous use of opioids 06/06/2017   ? Coronary artery disease involving native coronary artery of native heart without angina pectoris 11/2020    nuclear study inferolateral infarct, reversible ischemia apex   ? Deep vein thrombosis (DVT) of distal vein of left lower extremity (CMS/HCC) 09/26/2017   ? Hyperlipidemia    ? Hypertension    ? Migraine    ? Pulmonary hypertension (CMS/HCC) 09/2020    echo  RVSP 71   ? Risk for falls    ? Sciatica    ? Tobacco abuse      Past Surgical History:   Procedure Laterality Date   ? CHOLECYSTECTOMY     ? HYSTERECTOMY     ? REPLACEMENT TOTAL KNEE      Right   ? SHOULDER SURGERY      Right     Family History   Problem Relation Age of Onset   ? Breast cancer Sister    ? Coronary artery disease Mother    ? Diabetes Mother    ? Hypertension Mother    ? Lung cancer Mother    ? Migraines Mother    ? Ovarian cancer Mother    ? Diabetes Brother    ? Diabetes Sister     ? COPD Sister    ? Breast cancer Sister    ? Cancer Father      Social History     Socioeconomic History   ? Marital status: Legally Separated     Spouse name: Not on file   ? Number of children: 2   ? Years of education: Not on file   ? Highest education level: Not on file   Occupational History   ? Occupation: Solicitor     Comment: retired   Tobacco Use   ? Smoking status: Every Day     Packs/day: 0.50     Types: Cigarettes     Start date: 72     Last attempt to quit: 07/09/2017     Years since quitting: 3.9   ? Smokeless tobacco: Never   Vaping Use   ? Vaping Use: Never used   Substance and Sexual Activity   ? Alcohol use: No   ? Drug use: No   ? Sexual activity: Defer   Other Topics Concern   ? Not on file   Social History Narrative  Bookeeper/ accounting - office work . Had two children, but daughter passed away. Other child lives in [place], [place]. Patient lives with "friend." Uses walker in house.     Social Determinants of Health     Financial Resource Strain: Not on file   Food Insecurity: Not on file   Transportation Needs: Not on file   Physical Activity: Not on file   Stress: Not on file   Social Connections: Not on file   Intimate Partner Violence: Not on file   Housing Stability: Not on file      Review of Systems     Review of Systems   Constitutional: Negative for unexpected weight change.   HENT: Negative for voice change.    Eyes: Negative for visual disturbance.   Respiratory: Negative for wheezing.    Cardiovascular: Negative for chest pain.   Gastrointestinal: Negative for abdominal pain.   Endocrine: Negative for cold intolerance and heat intolerance.   Genitourinary: Negative for difficulty urinating.   Musculoskeletal: Negative for arthralgias and back pain.   Neurological: Positive for headaches.      Physical Exam     ED Triage Vitals [06/20/21 0441]   Temp Heart Rate Resp BP SpO2   97 F (36.1 C) 78 17 127/74 (!) 84 %     Temp Source Heart Rate Source Patient Position BP Location FiO2  (%)   Tympanic Monitor -- Right arm --     Physical Exam  Constitutional:       General: She is not in acute distress.     Appearance: She is well-developed.   HENT:      Head: Normocephalic and atraumatic.      Right Ear: External ear normal.      Left Ear: External ear normal.      Nose: Nose normal.      Mouth/Throat:      Mouth: Mucous membranes are moist.   Eyes:      Extraocular Movements: Extraocular movements intact.      Pupils: Pupils are equal, round, and reactive to light.   Cardiovascular:      Rate and Rhythm: Normal rate and regular rhythm.   Pulmonary:      Effort: Pulmonary effort is normal.      Breath sounds: Normal breath sounds.   Abdominal:      General: Abdomen is flat. There is no distension.     Musculoskeletal:         General: No tenderness. Normal range of motion.      Cervical back: Normal range of motion and neck supple. Skin:     General: Skin is warm and dry.      Capillary Refill: Capillary refill takes less than 2 seconds.   Neurological:      General: No focal deficit present.      Mental Status: She is alert. Mental status is at baseline.      GCS: GCS eye subscore is 4. GCS verbal subscore is 5. GCS motor subscore is 6.      ED Course & MDM     Labs Reviewed   URINALYSIS WITH CULTURE REFLEX   CBC AND DIFFERENTIAL   COMPREHENSIVE METABOLIC PANEL   POCT ARTERIAL BLOOD GAS     CT Head WO Contrast    (Results Pending)   XR Chest 1 view PA or AP    (Results Pending)     Incomplete F Codes     No Incomplete F-Codes Found  Completed F Codes     No Incomplete F-Codes Found       Medical Decision Making  71 year old female here with headache.    Differential diagnosis includes  brain tumors - no focal neurological deficit, will scan head  rhinosinusitis - No upper respiratory symptoms  Central infection - no focal neurological deficit, no fever  diseases of cranial vasculature - no focal neurological deficit  temporomandibular joint disease - no jaw claudication, no visual  changes  Migraine headache - Not consistent with timeline, but will try a dose of reglan    Patient is also hypoxic on pulse oximetry, but hands are extremely cold.  Will warm and recheck, will get ABG.    ED Course as of 06/20/21 0629   Fri Jun 20, 2021   0981 I independently viewed and interpreted EKG, normal sinus rhythm, ventricular rate 81.  No ST elevation.     [RM]   0514 T-wave inversions lead II, III, unchanged from 2022.  [RM]   [id number] Patient now almost entirely unresponsive, pupils constricted.  Will do a dose of Narcan. [RM]     ED Course User Index  [RM] Edmonia Caprio, DO             ED Critical Care Provider Statement    Critical Care Provider Statement:             Edmonia Caprio, DO  06/20/21 7829    Electronically signed by Edmonia Caprio, DO at 06/20/2021  6:29 AM EST

## 2021-06-20 NOTE — Assessment & Plan Note (Signed)
Associated Problem(s): Chronic kidney disease, stage III (moderate) (CMS/HCC)  Formatting of this note might be different from the original.  Acute on chronic renal failure   Perform renal ultrasound   Avoid NSAIDs and nephrotoxic agents   Check urine electrolytes  Start gentle IVF   Tract intake and output daily weights  Electronically signed by Rogelio Seen, MD at 06/20/2021  6:42 AM EST

## 2021-06-20 NOTE — Assessment & Plan Note (Signed)
Associated Problem(s): Chronic prescription benzodiazepine use  Formatting of this note might be different from the original.  Complicating risk factor considering the patient's advanced age risk for mortality due to renal failure and multiple chronic problems such as COPD and noncompliance  Electronically signed by Rogelio Seen, MD at 06/20/2021  6:42 AM EST

## 2021-06-20 NOTE — Assessment & Plan Note (Signed)
Associated Problem(s): Polycythemia  Formatting of this note might be different from the original.  Heparin DVT prophylaxis   Start enteric-coated daily aspirin.  Electronically signed by Rogelio Seen, MD at 06/20/2021  6:43 AM EST

## 2021-06-20 NOTE — ED Triage Notes (Signed)
Formatting of this note might be different from the original.  Pt reports 2 week history of headache. Pt also reports mild abdominal pain and bilateral lower leg swelling.   Electronically signed by Elijah Birk, RN at 06/20/2021  4:32 AM EST

## 2021-06-20 NOTE — H&P (Signed)
Formatting of this note is different from the original.  General H & P  Chief Complaint:  Headache and body aches  Subjective     HPI:  Lindsey Yang is a 71 y.o.female with a past medical history of chronic kidney disease, diet-controlled diabetes mellitus type 2, chronic pain syndrome on chronic opiates benzodiazepines and Lyrica, essential hypertension, polycythemia, coronary artery disease, debility, and history of opiate overdose who presents to [facility] for evaluation for headache and body aches times several weeks and was found to be acutely hypoxic on room air-the patient was previously prescribed were 2-4 L nasal cannula but from review of notes from September of 2022 the patient has not been wearing her supplemental oxygen as directed.  The patient required 10 L of nasal cannula oxygen to maintain oxygen saturation 91% in the emergency room.  The patient underwent imaging which on preliminary review shows no acute pneumothorax pleural effusions-the patient was somnolent on interview and unable to give very much history.  ABG shows a mixed gas-with metabolic acidosis and hypoxia.  Preliminary labs drawn show a serum creatinine of 4 and anion gap of 15.  Stable sodium and potassium.  Urinalysis is pending at this time CT of the head without contrast was also performed and appears on preliminary review to be negative for acute findings EKG was performed showing a sinus rate of 81 with chronic ST wave depressions in lead 3 in 2.  QTC is 473.  Is of note the patient underwent nuclear medicine stress test in the previous year which was shown to have an apical infarct.  Considering the patient's tenuous renal function the patient did not undergo a cardiac catheterization as the risk to her morbidity and mortality.  She was recommended medical management at that time.  It appears the patient has not been taking his medications and has been noncompliant with follow-up.  If she patient  continues to smoke 2 packs for cigarettes per day.      Review of Systems    unreliable considering the above.  Allergies:  Phenobarbital, Ace inhibitors, Contrast [iodinated contrast media], Ibuprofen, Lamotrigine, Penicillins, Pregabalin, Shellfish (or derived products), Strawberry, Sulfa antibiotics, Sulfamethoxazole-trimethoprim, and Lactose intolerance (gi)    Prior to Admission medications    Medication Sig Start Date End Date Taking? Authorizing Provider   clonazePAM (KlonoPIN) 1 MG tablet Take 0.5 tablets (0.5 mg total) by mouth 2 (two) times a day Indications: other 11/14/20  Yes Mary Sella, MD   oxyCODONE-acetaminophen (PERCOCET) 7.5-325 MG per tablet  01/08/21  Yes Historical Provider, MD   albuterol HFA (PROAIR HFA) 108 (90 BASE) MCG/ACT aerosol solution Inhale 1 puff every 4 (four) to 6 (six) hours as needed. For shortness of breath 08/03/13   Leandra Kern, MD   allopurinol (ZYLOPRIM) 100 MG tablet Take 100 mg by mouth 2 (two) times a day 09/09/17   Historical Provider, MD   amitriptyline (ELAVIL) 50 MG tablet Take 100 mg by mouth Apply every 6 hours for 2 doses in a 24 hour period.    Shawnie Dapper, MD   amLODIPine (NORVASC) 5 MG tablet Take 5 mg by mouth daily 08/17/17   Historical Provider, MD   aspirin 81 MG chewable tablet Chew 81 mg daily    Dawna Part, NP   atorvastatin (LIPITOR) 40 MG tablet Take 1 tablet (40 mg total) by mouth daily For cholesterol 11/29/20 11/29/21  Mary Sella, MD   calcium citrate-vitamin D (CITRACAL+D) [phone]  MG-UNIT per tablet Take 1 tablet by mouth daily    Dawna Part, NP   cetirizine (ZyrTEC) 10 MG tablet Take 10 mg by mouth At Bedtime. 11/19/16   Historical Provider, MD   diclofenac (VOLTAREN) 1 % gel Apply 4 g topically 4 (four) times a day as needed    Historical Provider, MD   dicyclomine (BENTYL) 10 MG capsule  01/07/21   Historical Provider, MD   DULoxetine (CYMBALTA) 60 MG capsule delayed-release particles Take 60 mg by mouth 2 (two) times a day  Indications: other  06/14/12   Leandra Kern, MD   EPINEPHrine (EPIPEN 2-PAK) 0.3 MG/0.3ML solution auto-injector Inject 0.3 mL into the shoulder, thigh, or buttocks. As directed 04/27/13   Leandra Kern, MD   fluticasone furoate-vilanterol (BREO ELLIPTA) 100-25 MCG/INH inhaler Inhale 1 puff daily. Indications: Chronic Obstructive Lung Disease    Shawnie Dapper, MD   fluticasone-salmeterol (ADVAIR DISKUS) 250-50 MCG/DOSE diskus inhaler Inhale 1 puff 2 (two) times a day. Rinse mouth with water after use.  Don't swallow. 10/02/16 10/02/17  Harle Stanford, NP   furosemide (LASIX) 20 MG tablet Take 2 tablets (40 mg total) by mouth 2 (two) times a day Indications: Edema, High Blood Pressure Disorder  Patient taking differently: Take 40 mg by mouth 3 (three) times a day Indications: Edema, High Blood Pressure Disorder 11/29/20   Mary Sella, MD   GoodSense Nicotine 2 MG gum  01/06/21   Historical Provider, MD   Lidocaine, Anorectal, 5 % cream Apply 1 application topically 3 (three) times a day 06/07/20   Langley Adie, DPM   metoprolol succinate XL (TOPROL-XL) 100 MG 24 hr tablet Take 100 mg by mouth daily. Indications: High Blood Pressure Disorder    Shawnie Dapper, MD   metoprolol tartrate (LOPRESSOR) 100 MG tablet Take 100 mg by mouth in the morning and at bedtime    Historical Provider, MD   mirtazapine (REMERON) 15 MG tablet Take 15 mg by mouth at bedtime as needed (other). Indications: other    Shawnie Dapper, MD   naloxone [facility]) 4 MG/0.1ML liquid Administer 1 spray into one nostril as needed for opioid reversal May repeat every 2 to 3 minutes in alternating nostrils until medical assistance becomes available.    Historical Provider, MD   oxyCODONE-acetaminophen (PERCOCET) 10-325 MG per tablet Take 0.5 tablets by mouth 2 (two) times a day as needed for severe pain (PSR 7-10) 11/14/20   Mary Sella, MD   Oxygen Therapy Inhale 2 L as needed (other). Indications: other    Shawnie Dapper, MD   potassium chloride  (K-DUR,KLOR-CON) 10 MEQ CR tablet Take 10 mEq by mouth    Historical Provider, MD   SUMAtriptan (IMITREX) 50 MG tablet Take 50 mg by mouth once as needed for migraine May repeat dose once in 2 hours if no relief.  Do not exceed 2 doses in 24 hours.    Historical Provider, MD   topiramate (TOPAMAX) 25 MG tablet Take 25 mg by mouth.    Historical Provider, MD   triamcinolone (KENALOG) 0.1 % cream  01/22/21   Historical Provider, MD   metoprolol succinate XL (TOPROL-XL) 100 MG 24 hr tablet Take 1 tablet (100 mg total) by mouth daily. 06/08/17 06/20/21  Merceda Elks, MD     Past Medical History:   Diagnosis Date   ? Arthritis    ? Chronic kidney disease, stage III (moderate) (CMS/HCC) 04/27/2013   ? Chronic pain syndrome    ?  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 Chronic respiratory failure (CMS/HCC) 09/30/2016   ? Chronic, continuous use of opioids 06/06/2017   ? Coronary artery disease involving native coronary artery of native heart without angina pectoris 11/2020    nuclear study inferolateral infarct, reversible ischemia apex   ? Deep vein thrombosis (DVT) of distal vein of left lower extremity (CMS/HCC) 09/26/2017   ? Hyperlipidemia    ? Hypertension    ? Migraine    ? Pulmonary hypertension (CMS/HCC) 09/2020    echo  RVSP 71   ? Risk for falls    ? Sciatica    ? Tobacco abuse      Past Surgical History:   Procedure Laterality Date   ? CHOLECYSTECTOMY     ? HYSTERECTOMY     ? REPLACEMENT TOTAL KNEE      Right   ? SHOULDER SURGERY      Right     Family History:  family history includes Breast cancer in her sister and sister; COPD in her sister; Cancer in her father; Coronary artery disease in her mother; Diabetes in her brother, mother, and sister; Hypertension in her mother; Lung cancer in her mother; Migraines in her mother; Ovarian cancer in her mother.  She indicated that her mother is deceased. She indicated that her father is deceased. She indicated that both of her sisters are alive. She  indicated that the status of her brother is unknown.    Social History:   reports that she has been smoking cigarettes. She started smoking about 47 years ago. She has been smoking an average of .5 packs per day. She has never used smokeless tobacco. She reports that she does not drink alcohol and does not use drugs.    Physical Exam    VITALS: BP 122/85   Pulse 78   Temp 97 F (36.1 C) (Tympanic)   Resp 17   SpO2 94%     GENERAL:  Patient appears to be sleeping does not awakens easily requires significant stimulation to awaken her.  She gives simple one-word answers to questions.    CV: RRR, S1S2, no M/R/G    LUNGS:  Diminished at the bases    GI: +BS, S/NT/ND    NEURO: CN 2-12 intact bilaterally, no motor/sensory deficits noted    HENT: Normocephalic, atraumatic, MMM    EYES: EOMI, Sclera nonicteric, PERRL    SKIN: No rashes or ecchymoses noted    EXTREMITIES: No edema noted    PULSES: 2+ B/L radial, DP, and PT    Labs:  Lab Results     ID Description Status Collection Date/Time    [id number] Urinalysis With Culture Reflex  Collected (06/20/21 0647) 06/20/21 0647    [id number] CBC and differential (Abnormal) Final result 06/20/21 0457      Result Value Flag Comment    White Blood Cell 7.7      Red Blood Cell 5.28      Hemoglobin 17.4 High     Hematocrit 53.2 High     Mean Cell Volume 100.8 High     Mean Cell Hemoglobin 32.9 High     Mean Cell Hemoglobin Concentration 32.6 Low     Red Cell Diameter Width 16.7 High     Platelet Count 183      Mean Platelet Volume 8.9      Neutrophils Automated Percentage 63.2      Lymphocytes Automated Percentage 22.8      Monocytes Automated Percentage 11.9  Eosinophils Automated Percentage 1.5      Basophils Automated Percentage 0.6      Neutrophils Automated Absolute Number 4.9      Lymphocytes Automated Absolute Number 1.8      Monocytes Automated Absolute Number 0.9      Eosinophils Automated Absolute Number 0.1 Low     Basophils Automated Absolute Number 0.0           [id number] Comprehensive metabolic panel  In process 06/20/21 0617    [id number] Respiratory Pathogen Panel by PCR  Collected (06/20/21 0627) 06/20/21 0627    [id number] Streptococcus pneumoniae Antigen, Urine  Collected (06/20/21 7371) 06/20/21 0647    [id number] Legionella Antigen, Urine  Collected (06/20/21 6270) 06/20/21 0647    [id number] Creatinine, urine, random  Collected (06/20/21 2993) 06/20/21 0647    [id number] Urea Nitrogen, Urine  Collected (06/20/21 8101) 06/20/21 0647    [id number] SARS-CoV-2, NAA  Final result 06/20/21 0510      Result Value Flag Comment    SARS-CoV-2 (Acute Misc.) Not Detected      SARS-CoV-2 Resulting Lab [facility] ID Now  This test has not been FDA cleared or approved.  This test has been  authorized by FDA under an Emergency Use Authorization (EUA).  This test is  only authorized for the duration of time the declaration that circumstances  exist justifying the authorization of the emergency use of in vitro  diagnostic tests for detection of SARS-CoV-2 virus and/or diagnosis of  COVID-19 infection.    The Abbott ID Now is a rapid, instrument-based, isothermal nucleic acid  amplification technology used for the detection of nucleic acid from  SARS-CoV-2.    When diagnostic testing is negative/Not Detected, the possibility of a  false negative result should be considered in the context of a patient's  recent exposures and the presence of clinical signs and symptoms consistent  with COVID-19.  An individual without symptoms of COVID-19 and who is not  shedding SARS-CoV-2 virus should expect to have a Negative/Not Detected  result in this assay.    Performed at  [facility]  [address] [place]  [address]            Lab results have been reviewed by myself as of June 20, 2021    Imaging:     Imaging (Last 3 days)     Procedure Component Value Units Date/Time    CT Head WO Contrast [[id number]] Resulted: 06/20/21 0549    Order Status: Sent Updated: 06/20/21 0550     CT Thorax WO Contrast [[id number]] Resulted: 06/20/21 0550    Order Status: Sent Updated: 06/20/21 0550       Radiology results have been reviewed by myself as of June 20, 2021    EKG:  EKG Stress Test Results (Last 7 days)     ** No results found for the last 168 hours. **       EKG Results (last 7 days)     ** No results found for the last 168 hours. **       EKG results have been reviewed by myself as of June 20, 2021    Vital Signs:  Patient Vitals for the past 24 hrs:   BP Temp Temp src Pulse Resp SpO2   06/20/21 0530 122/85 -- -- 78 -- 94 %   06/20/21 0528 -- -- -- 78 -- 93 %   06/20/21 0526 -- -- -- 78 -- 93 %  06/20/21 0515 128/90 -- -- 80 17 (!) 89 %   06/20/21 0441 127/74 97 F (36.1 C) Tympanic 78 17 (!) 84 %     Intake/Output:  No intake or output data in the 24 hours ending 06/20/21 0650    Assessment/Plan     Principal Problem:    Acute on chronic respiratory failure with hypoxia and hypercapnia (CMS/HCC)  Active Problems:    Chronic kidney disease, stage III (moderate) (CMS/HCC)    Chronic, continuous use of opioids    Chronic prescription benzodiazepine use    Polycythemia    Acute on chronic respiratory failure with hypoxia and hypercapnia (CMS/HCC)  Admit to intensive care unit   Multifactorial most likely to do with patient's severe COPD and pulmonary hypertension and acute COPD exacerbation will screen for viral respiratory panel pathogen negative for COVID-19 start doxycycline Solu-Medrol 40 q.6  Will order BiPAP repeat ABG   Unfortunately patient is noncompliant continues to take Lyrica, Percocet as well as clonazepam with diminished renal function.  Will perform transthoracic echocardiogram  Vital signs q.1h   Follow on telemetry   Polycythemia is likely complicating patient's chronic renal failure and severe pulmonary hypertension and COPD    Chronic kidney disease, stage III (moderate) (CMS/HCC)  Acute on chronic renal failure   Perform renal ultrasound   Avoid NSAIDs and  nephrotoxic agents   Check urine electrolytes  Start gentle IVF   Tract intake and output daily weights    Chronic prescription benzodiazepine use  Complicating risk factor considering the patient's advanced age risk for mortality due to renal failure and multiple chronic problems such as COPD and noncompliance    Polycythemia  Heparin DVT prophylaxis   Start enteric-coated daily aspirin.    Chronic, continuous use of opioids  Concurrently prescribed with benzodiazepines   Patient has a history of opiate overdose these medications continue to be prescribed to a patient with diminished renal function this increases her risk of mortality.  Discussed with patient though unclear if she comprehends. Has had prev hist of overdose    DVT prophylaxis  Heparin SQ    CODE STATUS     FULL CODE    I certified this patient meets inpatient criteria given without intervention medical management stabilization of the above the patient can be met with significant complication to include mortality.    Face to Face Patient Counseling / Coordinating Care >50% of Encounter Time: RHS F to F Enc Time (YES/NO): Yes  Total Encounter Time (in minutes):  I spent a total of 59 minute seeing this patient coordinating care and admission on this day.  Electronically signed by Teddy Spike, MD at 06/20/2021  6:55 AM EST

## 2021-06-20 NOTE — Progress Notes (Signed)
Formatting of this note might be different from the original.  Care Coordinator  Assessment Progress Note    S - Situation                      B - Background                        Primary Insurance:  Landscape [employer]  Secondary Insurance:  [employer] medicaid                          A - Assessment            Does the patient have an advanced or serious illness?: No                                                      R - Recommendations      Discharge Plan : Home, new meds        Does the patient have any DME equipment needs?: Patient has existing equipment      Interventions                  Teach Back Provided : Discharge Plan      Discharge Activities: Confirmed insurance benefits with patient, Social Determinants of Care Interventions (utilities/housing/shelter/food/transportation)        DC Plan  Primary Caregiver is willing and able to perform any and all duties?: Yes    Next review date: 06/25/21  Support Systems: Spouse/significant other    DME equipment needs: No equipment needed                Case Management assessment reviewed with the patient or primary caregiver?: Yes    Who will patient reside with? : Spouse/significant other  Patient wishes to be discharged to: Home or self-care      Silvio Pate, RN    Electronically signed by Silvio Pate, RN at 06/20/2021  9:12 AM EST

## 2021-06-20 NOTE — Progress Notes (Signed)
Formatting of this note is different from the original.  Upon chart review, patient's pulmonary tests were performed back in 2018 and listed below    Pulmonary functions are poor quality with marked variability of effort.  They are nonetheless adequate for interpretation.    The FEV1/FVC and FEV 3/FVC ratios are normal ruling out airways obstruction.  The FEV1 is 1.2 L, 85% of predicted.  There is no significant change after single dose of inhaled bronchodilators.    Airways resistance is normal.  Airways conductance is reduced.    Vital capacity is reduced however total lung capacity is normal this rules out restriction.  Thoracic gas volume is within normal limits indicating normal lasting recall of the lung.  Expiratory reserve volume is reduced.  Residual volume is elevated compared to total lung capacity.  This may be due to occult obstructive lung disease.    Diffusion capacity is reduced however patient had trouble completing test maneuvers.    This makes me believe that patient may not have underlying chronic respiratory disease.  Her ABG is also within normal limits with a mildly elevated pCO2 of 47 with the upper limit of normal being 45.  Patient has confirmed with our case management team that she has a 5 L O2 concentrator at home though she is not using it.  Patient's home pulmonologist in 2018 from the study above is Dr. Yetta Barre, once patient's mental status remained stable as she is significantly improved since this morning we will discharge her with close follow-up.    Dr. Wilmon Pali    Electronically signed by Gae Gallop, MD at 06/20/2021  1:49 PM EST

## 2021-06-20 NOTE — Assessment & Plan Note (Signed)
Associated Problem(s): Chronic, continuous use of opioids  Formatting of this note might be different from the original.  Concurrently prescribed with benzodiazepines   Patient has a history of opiate overdose these medications continue to be prescribed to a patient with diminished renal function this increases her risk of mortality.  Discussed with patient though unclear if she comprehends. Has had prev hist of overdose  Electronically signed by Rogelio Seen, MD at 06/20/2021  6:49 AM EST

## 2021-06-20 NOTE — Assessment & Plan Note (Signed)
Associated Problem(s): Acute on chronic respiratory failure with hypoxia and hypercapnia (CMS/HCC)  Formatting of this note might be different from the original.  Admit to intensive care unit   Multifactorial most likely to do with patient's severe COPD and pulmonary hypertension and acute COPD exacerbation will screen for viral respiratory panel pathogen negative for COVID-19 start doxycycline Solu-Medrol 40 q.6  Will order BiPAP repeat ABG   Unfortunately patient is noncompliant continues to take Lyrica, Percocet as well as clonazepam with diminished renal function.  Will perform transthoracic echocardiogram  Vital signs q.1h   Follow on telemetry   Polycythemia is likely complicating patient's chronic renal failure and severe pulmonary hypertension and COPD    Electronically signed by Rogelio Seen, MD at 06/20/2021  6:41 AM EST

## 2021-06-20 NOTE — Consults (Signed)
Formatting of this note is different from the original.    Nutrition Assessment    Per review of Hospitalist note: Lindsey Yang is a 71 y.o.female with a past medical history of currently smoking cigarettes, chronic kidney disease, diet-controlled diabetes mellitus type 2, chronic pain syndrome on chronic opiates benzodiazepines and Lyrica, essential hypertension, polycythemia, coronary artery disease, debility, and history of opiate overdose who presents to [facility] for evaluation for headache and body aches times several weeks and was found to be acutely hypoxic on room air-the patient was previously prescribed were 2-4 L nasal cannula but from review of notes from September of 2022 the patient has not been wearing her supplemental oxygen as directed. The patient required 10 L of nasal cannula oxygen to maintain oxygen saturation 91% in the emergency room. The patient underwent imaging which on preliminary review shows no acute pneumothorax pleural effusions-the patient was somnolent on interview and unable to give very much history.  ABG shows a mixed gas-with metabolic acidosis and hypoxia.  Preliminary labs drawn show a serum creatinine of 4 and anion gap of 15.  Stable sodium and potassium.  Urinalysis is pending at this time CT of the head without contrast was also performed and appears on preliminary review to be negative for acute findings EKG was performed showing a sinus rate of 81 with chronic ST wave depressions in lead 3 in 2.  QTC is 473.  Is of note the patient underwent nuclear medicine stress test in the previous year which was shown to have an apical infarct.  Considering the patient's tenuous renal function the patient did not undergo a cardiac catheterization as the risk to her morbidity and mortality.  She was recommended medical management at that time.  It appears the patient has not been taking his medications and has been noncompliant with follow-up.  If she  patient continues to smoke 2 packs for cigarettes per day.      Patient discussed during [facility].     Patient is currently on Regular, Moderate CCD, Heart Healthy diet with 0% eaten for breakfast this morning.     Noted patient with HgA1c: 6.5 and bedside BG 115, 155.  Patient may benefit from diabetic diet education when diagnosis for diabetes is in  consideration.    Please see below for RD recommendations.  RD will continue to follow and monitor.      Reason for Assessment:  Admit Diagnosis     Hospital Problems:  Principal Problem:    Acute on chronic respiratory failure with hypoxia and hypercapnia (CMS/HCC)  Active Problems:    Chronic kidney disease, stage III (moderate) (CMS/HCC)    Chronic, continuous use of opioids    Chronic prescription benzodiazepine use    Polycythemia    Dietitian Recommendation:    1. Continue current diet: Regular, Moderate CCD, Heart Healthy and encourage intake of meals and fluids    2. Diet education for Diabetes when appropriate    3. Oral nutrition supplement available for meal intake <50%     Allergy(ies)  Phenobarbital, Ace inhibitors, Contrast [iodinated contrast media], Ibuprofen, Lamotrigine, Penicillins, Pregabalin, Shellfish (or derived products), Strawberry, Sulfa antibiotics, Sulfamethoxazole-trimethoprim, and Lactose intolerance (gi)    Nutrition Diagnosis: Altered Nutrition-Related Laboratory Values related to endocrine and renal dysfunction as evidenced by elevated HgA1c, BUN, Creatine, low GFR    PO intake for past 3 days:   Flowsheet Data By Column (last 72 hours)     Intake/Output  Date/Time P.O. Percent Meals Eaten (%)                06/20/21 0708 0 mL 0                 Height: 4\' 11"  (149.9 cm)  Weight: 139 lb 12.4 oz (63.4 kg)     Wt Readings from Last 3 Encounters:   06/20/21 139 lb 12.4 oz (63.4 kg)   03/21/21 140 lb 9.6 oz (63.8 kg)   02/06/21 136 lb (61.7 kg)     Past Medical History:   Diagnosis Date   ? Arthritis    ? Chronic kidney disease, stage  III (moderate) (CMS/HCC) 04/27/2013   ? Chronic pain syndrome    ? 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 Chronic respiratory failure (CMS/HCC) 09/30/2016   ? Chronic, continuous use of opioids 06/06/2017   ? Coronary artery disease involving native coronary artery of native heart without angina pectoris 11/2020    nuclear study inferolateral infarct, reversible ischemia apex   ? Deep vein thrombosis (DVT) of distal vein of left lower extremity (CMS/HCC) 09/26/2017   ? Hyperlipidemia    ? Hypertension    ? Migraine    ? Pulmonary hypertension (CMS/HCC) 09/2020    echo  RVSP 71   ? Risk for falls    ? Sciatica    ? Tobacco abuse      Past Surgical History:   Procedure Laterality Date   ? CHOLECYSTECTOMY     ? HYSTERECTOMY     ? REPLACEMENT TOTAL KNEE      Right   ? SHOULDER SURGERY      Right     Anthropometrics:  Height: 4\' 11"  (149.9 cm)  Weight: 139 lb 12.4 oz (63.4 kg)  BMI (Calculated): 28.2 Kg/m2  IBW/kg (Calculated) FEMALE: 43.2 kg    Energy Needs:  Total Energy Estimated Needs: 1295-1511 Kcal  Method for Estimating Needs:  (30-35 Kcal/kg/IBW)  Total Protein Estimated Needs: 26-43 g  Method for Estimating Needs:  (0.6-1.0 g Pro/kg/IBW)    Medications:  Scheduled:  allopurinol, 100 mg, Oral, BID  amitriptyline, 50 mg, Oral, Bedtime  amLODIPine, 5 mg, Oral, Daily  aspirin, 81 mg, Oral, Daily  atorvastatin, 40 mg, Oral, Daily  doxycycline (VIBRAMYCIN) IVPB 100 mg in 100 mL (Mini-Bag Plus), 100 mg, Intravenous, Q12 Hrs  fluticasone furoate-vilanterol, 1 puff, Inhalation, Daily  heparin (porcine), 5,000 Units, Subcutaneous, Q12 Hrs SCH  insulin lispro, 0-6 Units, Subcutaneous, 4x daily meals HS  ipratropium-albuterol, 3 mL, Nebulization, Q6 Hrs RT  methylPREDNISolone sodium succinate, 40 mg, Intravenous, Q6 Hrs    Infusions:  dextrose, 100 mL/hr  sodium chloride, 100 mL/hr, Last Rate: 100 mL/hr (06/20/21 0732)    PRN:  ?  acetaminophen  ?  bisacodyl  ?  dextrose **OR** dextrose  ?  dextrose  ?   docusate sodium  ?  glucagon (human recombinant)  ?  glucose  ?  glucose  ?  naloxone  ?  ondansetron ODT  ?  tap water    Labs:    Complete Metabolic Profile   Lab Results   Component Value Date    NA 137 06/20/2021    NA 138 06/20/2021    K 4.8 06/20/2021    K 4.7 06/20/2021    CL 107 06/20/2021    CL 108 06/20/2021    CO2 21 (L) 06/20/2021    CO2 21 (L) 06/20/2021    ANIONGAP 9 06/20/2021  BCR 15.4 06/20/2021    BUN 63 (H) 06/20/2021    BUN 58 (H) 06/20/2021    CREATININE 4.08 (H) 06/20/2021    CREATININE 4.00 (H) 06/20/2021    GLUCOSE 101 06/20/2021    GLUCOSE 100 06/20/2021    CALCIUM 8.5 06/20/2021    PROT 6.6 06/20/2021    ALBUMIN 3.4 06/20/2021    GLOB 3.2 06/20/2021    AGRATIO 1.1 06/20/2021    ALKPHOS 188 (H) 06/20/2021    AST 20 06/20/2021    ALT 15 06/20/2021    BILITOT 0.4 06/20/2021    MG 2.0 11/27/2020       No results found for: PREALBUMIN    Hemoglobin A1C Percentage   Date Value Ref Range Status   06/20/2021 6.5 (H) 0.0 - 5.7 % Final     Comment:     An initial HgbA1C level greater than or equal to 6.5 % is one of the  current criteria in the American Diabetes Association (ADA) Standards used  for the diagnosis of diabetes.  A HgbA1C result in the range 5.7-6.4 % is  considered pre-diabetic.  The therapeutic HgbA1C goal for glycemic control  in a diabetic patient, according to the ADA, is < 7.0%.      Goals: Maintain Weight, Achieve Adequate Intake, Tolerate Current Diet  and Achieve Glycemic Control    Monitor and Evaluation:  Indicators: Monitor diet tolerance and meal intake, weight, labs and diet education when appropriate     Criteria: Admit Diagnosis and Altered Labs     Nutrition Risk:   Moderate Risk      Electronically signed by Jeanine Luz, RD at 06/20/2021  1:26 PM EST

## 2021-06-21 NOTE — Progress Notes (Signed)
Formatting of this note might be different from the original.  Received from ICU, up in chair, tele NSR, VSS on 11liters via salter, sats 95% while up in chair, watching TV and visiting with family  Electronically signed by Zorita Pang, RN at 06/21/2021  2:29 PM EST

## 2021-06-21 NOTE — Unmapped (Signed)
Formatting of this note might be different from the original.  Goals: improved mental status, improved respiratory status      Identify possible barriers to meeting goals/advancing plan of care: polypharmacy, medical noncompliance    Stability of the patient: Moderately Stable - Low risk of patient condition declining or worsening    End of Shift Summary: awake, alert out of bed to chair, tolerating diet, on salter cannula at 11 liters    Electronically signed by Renne Crigler, RN at 06/21/2021  9:27 AM EST

## 2021-06-21 NOTE — Progress Notes (Signed)
Formatting of this note is different from the original.  Images from the original note were not included.  Critical Care Progress Note  Chief Complaint:Headache and body aches  Subjective   Upon seeing her this AM, she is being taken off BIPAP, she did not sleep last night but says this is normal for her, also complains of B/L LE swelling which is worse during the day and is chronic, she states it is relieved by legs being risen, she has no nausea, no vomiting, no chest pain or SOB.         Allergy(ies):  Phenobarbital, Ace inhibitors, Contrast [iodinated contrast media], Ibuprofen, Lamotrigine, Penicillins, Pregabalin, Shellfish (or derived products), Strawberry, Sulfa antibiotics, Sulfamethoxazole-trimethoprim, and Lactose intolerance (gi)    Medications:  Scheduled:  allopurinol, 100 mg, Oral, BID  amitriptyline, 50 mg, Oral, Bedtime  aspirin, 81 mg, Oral, Daily  atorvastatin, 40 mg, Oral, Daily  doxycycline (VIBRAMYCIN) IVPB 100 mg in 100 mL (Mini-Bag Plus), 100 mg, Intravenous, Q12 Hrs  fluticasone furoate-vilanterol, 1 puff, Inhalation, Daily  heparin (porcine), 5,000 Units, Subcutaneous, Q12 Hrs [place]  insulin lispro, 0-6 Units, Subcutaneous, 4x daily meals HS  predniSONE, 40 mg, Oral, Daily    Infusions:  dextrose, 100 mL/hr    PRN:  ?  acetaminophen  ?  bisacodyl  ?  dextrose **OR** dextrose  ?  dextrose  ?  docusate sodium  ?  glucagon (human recombinant)  ?  glucose  ?  glucose  ?  ipratropium-albuterol  ?  naloxone  ?  ondansetron ODT  ?  oxyCODONE-acetaminophen  ?  tap water    Vital Signs:   Last 24h Min/Max     Temp 98.1 F (36.7 C)  Temp  Min: 96.6 F (35.9 C)  Max: 98.4 F (36.9 C)   HR 71  Pulse  Min: 70  Max: 78   RR 16  Resp  Min: 11  Max: 37   BP 97/65  BP  Min: 84/57  Max: 137/113   SpO2 98 %  SpO2  Min: 89 %  Max: 99 %     Patient Vitals for the past 24 hrs:   BP MAP (mmHg) Temp Temp src Pulse Resp SpO2 Weight   06/21/21 0719 97/65 76 98.1 F (36.7 C) Tympanic 71 16 98 % --   06/21/21 0600  -- -- -- -- -- -- -- 147 lb 11.3 oz (67 kg)   06/21/21 0319 118/69 86 -- -- 78 16 92 % --   06/21/21 0309 -- -- 98.4 F (36.9 C) Tympanic -- -- -- --   06/21/21 0219 102/65 77 96.6 F (35.9 C) Tympanic 71 17 97 % --   06/21/21 0119 (!) 84/57 67 -- -- 70 (!) 11 97 % --   06/21/21 0019 (!) 88/56 67 -- -- 74 15 (!) 89 % --   06/20/21 2345 -- -- -- -- 77 16 93 % --   06/20/21 2317 -- -- -- -- 71 (!) 13 96 % --   06/20/21 2216 -- -- -- -- 73 (!) 13 97 % --   06/20/21 2029 (!) 137/113 -- -- -- 76 (!) 23 94 % --   06/20/21 2020 -- (!) 123 -- -- -- -- -- --   06/20/21 2004 (!) 123/93 105 -- -- 74 16 96 % --   06/20/21 1949 126/88 102 -- -- 76 19 92 % --   06/20/21 1934 123/70 87 -- -- 76 17 90 % --  06/20/21 1923 -- -- 96.6 F (35.9 C) Tympanic -- -- -- --   06/20/21 1749 92/65 76 -- -- 70 (!) 12 94 % --   06/20/21 1649 96/63 75 -- -- 74 (!) 37 (!) 89 % --   06/20/21 1549 97/64 75 97.9 F (36.6 C) Tympanic 76 (!) 22 91 % --   06/20/21 1514 -- -- -- -- -- -- 96 % --   06/20/21 1449 107/70 81 -- -- 74 19 95 % --   06/20/21 1349 103/65 78 -- -- 76 17 97 % --   06/20/21 1249 97/64 76 -- -- 75 20 99 % --   06/20/21 1149 120/72 88 97.5 F (36.4 C) Tympanic 77 18 95 % --   06/20/21 0918 -- -- -- -- 77 (!) 28 -- --     Physical Exam  GEN: awake, alert, on 10L NC, in no resp distress  CV: RRR, S1S2, no M/R/G  LUNGS:  Diminished at the bases  GI: +BS, S/NT/ND  NEURO: CN 2-12 intact bilaterally, no motor/sensory deficits noted  HENT: Normocephalic, atraumatic, MMM  EYES: EOMI, Sclera nonicteric, PERRL  SKIN: No rashes or ecchymoses noted  EXTREMITIES: No edema noted  PULSES: 2+ B/L radial, DP, and PT    Labs:  Lab Results     ID Description Status Collection Date/Time    [id number] CBC (and Differential) (Abnormal) Final result 06/21/21 0527      Result Value Flag Comment    White Blood Cell 5.5      Red Blood Cell 4.63      Hemoglobin 15.2      Hematocrit 46.6      Mean Cell Volume 100.6 High     Mean Cell Hemoglobin 33.0 High      Mean Cell Hemoglobin Concentration 32.7 Low     Red Cell Diameter Width 15.4 High     Platelet Count 162      Mean Platelet Volume 10.1      Neutrophils Automated Percentage 90.5      Lymphocytes Automated Percentage 6.6      Monocytes Automated Percentage 2.6      Eosinophils Automated Percentage 0.0      Basophils Automated Percentage 0.3      Neutrophils Automated Absolute Number 5.0      Lymphocytes Automated Absolute Number 0.4 Low     Monocytes Automated Absolute Number 0.1      Eosinophils Automated Absolute Number 0.0 Low     Basophils Automated Absolute Number 0.0      Macrocytosis Slight Abnormal     Platelet Evaluation Low Normal      Toxic Granulation Slight Abnormal         [id number] Comprehensive Metabolic Panel (Abnormal) Final result 06/21/21 0527      Result Value Flag Comment    Sodium Level 135      Potassium Level 4.6      Chloride 106      Carbon Dioxide 20 Low     Anion Gap 9      Blood Urea Nitrogen 61 High     Creatinine 3.38 High     BUN/Creatinine ratio 18.0      Glucose Random 141 High     Calcium Level Total 8.1 Low     Protein Total 6.0 Low     Albumin Level 3.1 Low     Globulin 2.9      Albumin/Globulin ratio 1.1      Alkaline Phosphatase  159 High     Aspartate Aminotransferase 15      Alanine Aminotransferase 13      Bilirubin Total 0.5          [id number] EGFR (Abnormal) Final result 06/21/21 0527      Result Value Flag Comment    eGFR 13 Low Estimated glomerular filtration rate to be used for non-African American  patients based on the MDRD (Modification of Diet in Renal Disease)  calculation.  Please refer to pharmacy personnel for calculation of a  modified Cockcroft-Gault estimated CrCl used in drug dosing protocols.     eGFR, African American 16 Low Estimated glomerular filtration rate to be used for [place] American  patients based on the MDRD (Modification of Diet in Renal Disease)  calculation.  Please refer to pharmacy personnel for calculation of a  modified  Cockcroft-Gault estimated CrCl used in drug dosing protocols.            Lab results have been reviewed by myself as of June 21, 2021    Ventilator Settings Last     Vent Mode      Resp Rate (Set)      Vt (Set, mL)      PEEP High (cm H2O)      PEEP Low (cm H2O)     Insp Flow (L/sec)     Trigger Sensitivity Flow (L/min)     Trigger Sensitivity Pressure (cm H2O)     Fi O2 50 %     Imaging:     Imaging (Last 3 days)     Procedure Component Value Units Date/Time    US Renal [[id number]] Collected: 06/20/21 1600    Order Status: Completed Updated: 06/20/21 1702    Narrative:      Jasmine December DESCRIPTION:  US RENAL    ACCESSION:  [id number]    COMPLETED DATE/TIME:  06/20/2021 4:48 pm    REASON FOR EXAM:  acute on chronic renal failure    COMPARISON:  Multiple previous CT scans to include April 25, 2015    REPORT:  No collecting system dilatation.  Both kidneys demonstrate diffusely increased cortical echogenicity consistent with medical renal disease.  Notably, the configuration of the left kidney is unchanged since the 2016 CT scan.  There is a right kidney cyst measuring estimated 2 cm in greatest dimension.    There are no acute bladder findings.     Impression:      No collecting system dilatation.  Bilateral medical renal disease.    INTERPRETING PHYSICIAN: Barnabas Lister, MD  WM//Report ID: [id number]    Creation Date: 06/20/21 5:00 pm    XR Chest 1 view PA or AP [[id number]] Collected: 06/20/21 0825    Order Status: Completed Updated: 06/20/21 0936    Narrative:      Jasmine December DESCRIPTION:  XR CHEST 1 VW    ACCESSION:  [id number]    COMPLETED DATE/TIME:  06/20/2021 9:29 am    REASON FOR EXAM:  hypoxic    COMPARISON:  11/27/2020    REPORT:  Frontal chest x-ray:  Degenerative change of both glenohumeral joints.  Postprocedural change about the right glenohumeral joint.  Cardiac monitoring leads.  No free air beneath hemidiaphragms.  Prominent cardiomediastinal silhouette configuration.  Central pulmonary vascular  prominence without significant change from previous.  No hilar mass.  Adequate inflation of the lungs.  No free-air, free-fluid or focal opacity.  Lung base atelectatic change.     Impression:      No acute  lung findings.    INTERPRETING PHYSICIAN: Barnabas Lister, MD  WM//Report ID: [id number]    Creation Date: 07-13-21 9:33 am    CT Thorax WO Contrast [[id number]]  (Abnormal) Collected: 07/13/2021 0545    Order Status: Completed Updated: 07-13-21 0701     F CODES P    Narrative:      EXAM DESCRIPTION:  CT THORAX WO CONTRAST    ACCESSION:  [id number]    COMPLETED DATE/TIME:  July 13, 2021 5:50 am    REASON FOR EXAM:  Dyspnea, chronic, unclear etiology    COMPARISON:  Chest radiograph 11/27/2020.    RADIATION DOSE:  DLP 458.2 mGy x cm.  Dose reduction technique using automated exposure control.    All CT scans at [facility] facilities are performed using dose optimization techniques as appropriate to exam including the following:  Automated exposure control, assessment of the mA or kV according to patient size, and use of iterative construction technique.    TECHNIQUE:  Helically acquired axial CT images were obtained of the thorax without intravenous contrast material.    REPORT:    Superficial soft tissues:  Within normal limits.  No focal areas of concern within the breast tissue, however CT is suboptimal for detection of breast abnormalities.  Thyroid gland:  Mildly enlarged and heterogeneous appearance of the thyroid.  No dominant nodule.  Mediastinum/hila:  No mediastinal lymphadenopathy by size criteria.  Limited evaluation for hilar adenopathy without the use of intravenous contrast.  Pulmonary arteries:  Enlarged main pulmonary artery which can be seen in setting pulmonary hypertension.  Thoracic aorta:  Normal diameter without aneurysm or stenosis.  Moderate calcified atherosclerotic disease of the aorta.  Heart:  Cardiomegaly.  No pericardial effusion.  Coronary artery disease.  Aortic valve  calcifications.  Esophagus: Normal  Upper abdominal contents:  Prior cholecystectomy.    Airways:  Within normal limits.  Lungs/pleura:  Septal thickening lung bases.  Scattered areas of mild ground-glass opacification.  No consolidation.  No pleural effusion or pneumothorax.    Skeletal structures:  No acute osseous abnormality.  Partially visualized surgical hardware within the proximal right humerus.  Degenerative changes of the thoracic spine.  No aggressive osseous lesions.     Impression:      1. Septal thickening with scattered areas of ground-glass opacification throughout the lungs, favored to represent mild pulmonary edema.  2. Cardiomegaly with coronary artery disease.  3. Enlarged main pulmonary artery which can be seen in setting of pulmonary hypertension.    ++++++++++++++++++++++++++++++++++++++++++++++++++++++++++++++++++  +++++CLASS CODE+++++:  P-positive findings +++++ POSITIVE +++++  ++++++++++++++++++++++++++++++++++++++++++++++++++++++++++++++++++    Digital Imaging and Communications in Medicine (DICOM) format image data are available to nonaffiliated external healthcare facilities or entities on a secure, media free, reciprocally searchable basis with patient authorization for at least a 42-monthperiod after this study.    INTERPRETING PHYSICIAN: JRussella Dar MD  JDL//Report ID: [id number]   Creation Date: 003/05/20236:59 am    CT Head WO Contrast [[[id number]]Collected: 02023/03/050450    Order Status: Completed Updated: 0March 05, 20230655    Narrative:      EJasmine DecemberDESCRIPTION:  CT HEAD WO CONTRAST    ACCESSION:  [id number]   COMPLETED DATE/TIME:  22023-03-055:50 am    REASON FOR EXAM:  Headache, chronic, new features or increased frequency    COMPARISON:  CT 11/12/2020.    RADIATION DOSE:  DLP 1134.3 mGy x cm.  Dose reduction technique using automated exposure control.    TECHNIQUE:  Axial noncontrast CT of the head is performed.    REPORT:  There is no evidence of acute intracranial hemorrhage,  mass effect, or shift of midline structures.  Involutional changes.  Scattered white matter hypoattenuation.  Intracranial atherosclerosis.  There is no evidence of hydrocephalus.  The calvarium is intact.  The visualized paranasal sinuses and tympanomastoid regions are clear.  Prior ocular lens surgery.     Impression:      No CT evidence of acute intracranial finding.  No significant interval change.    Digital Imaging and Communications in Medicine (DICOM) format image data are available to nonaffiliated external healthcare facilities or entities on a secure, media free, reciprocally searchable basis with patient authorization for at least a 38-monthperiod after this study.    INTERPRETING PHYSICIAN: JRussella Dar MD  JDL//Report ID: [id number]   Creation Date: 06/20/21 6:52 am       Radiology results have been reviewed by myself as of June 21, 2021    EKG:  EKG Stress Test Results (Last 7 days)     Procedure Component Value Units Date/Time    EKG [[[id number]]Resulted: 06/20/21 1002    Order Status: Completed Updated: 06/20/21 1436    Narrative:      Ordered by an unspecified provider.       EKG Results (last 7 days)     ** No results found for the last 168 hours. **       EKG results have been reviewed by myself as of June 21, 2021    Assessment/Plan     Principal Problem:    Acute on chronic respiratory failure with hypoxia and hypercapnia (CMS/HCC)  Active Problems:    Chronic kidney disease, stage III (moderate) (CMS/HCC)    Chronic, continuous use of opioids    Chronic prescription benzodiazepine use    Polycythemia    HPI  Lindsey Dolbowis a 71y.o.female with a past medical history of chronic kidney disease, diet-controlled diabetes mellitus type 2, chronic pain syndrome on chronic opiates benzodiazepines and Lyrica, essential hypertension, polycythemia, coronary artery disease, debility, and history of opiate overdose [facility], complicated by AMS, hypoxia, req 10L NC, ABG showed met acidosis, and hypoxia, placed on BIPAP and given narcan and brought to our ICU, she gained consciousness later in the day and now is at her baseline.  She mentioned she had a horrible HA and that is what brought her to the ER, she does not recall taking any pain meds she has been prescribed at home.     Assessment and Plan:    Acute on chronic respiratory failure with hypoxia and hypercapnia (CMS/HCC)  Admit to intensive care unit   Multifactorial most likely to do with patient's severe COPD and pulmonary hypertension and acute COPD exacerbation will screen for viral respiratory panel pathogen negative for COVID-19 start doxycycline Solu-Medrol 40 q.6  Will order BiPAP repeat ABG   Unfortunately patient is noncompliant continues to take Lyrica, Percocet as well as clonazepam with diminished renal function.  Will perform transthoracic echocardiogram  Vital signs q.1h   Follow on telemetry   Polycythemia is likely complicating patient's chronic renal failure and severe pulmonary hypertension and COPD    2/11: bipap at night seems to help patient with her resp status, she is on 10 L NC during the day, appears to have 5 L O2 concentrator at home, at this time I  changed her steroids to prednisone 25m daily, will cont COPD treatment, pending echo, patient is stable for floors, will cont to wean O2 as tolerated, PT ordered   -somnolence seems to be related to substance use from medication overdose at home   -cont doxyxycline for COPD exacerbation, transitioned to PO today       Chronic kidney disease, stage III (moderate) (CMS/HCC)  Acute on chronic renal failure   Perform renal ultrasound   Avoid NSAIDs and nephrotoxic agents   Check urine electrolytes  Start gentle IVF   Tract intake and output daily weights    2/11: Cr improved with IVF hydration, likely pre renal from hypovolemia,     Chronic prescription benzodiazepine  use  Complicating risk factor considering the patient's advanced age risk for mortality due to renal failure and multiple chronic problems such as COPD and noncompliance    Polycythemia  Heparin DVT prophylaxis   Start enteric-coated daily aspirin.    Chronic, continuous use of opioids  Concurrently prescribed with benzodiazepines   Patient has a history of opiate overdose these medications continue to be prescribed to a patient with diminished renal function this increases her risk of mortality.  Discussed with patient though unclear if she comprehends. Has had prev hist of overdose      DVT prophylaxis  Heparin SQ    CODE STATUS   FULL CODE    I certified this patient meets inpatient criteria given without intervention medical management stabilization of the above the patient can be met with significant complication to include mortality.      Face to Face Patient Counseling / Coordinating Care >50% of Encounter Time: RHS F to F Enc Time (YES/NO): Yes  Total Encounter Time (in minutes):  I spent a total of 40 minute seeing this patient coordinating care and admission on this day.      Electronically signed by SHenrietta Dine MD at 06/21/2021  8:18 AM EST

## 2021-06-21 NOTE — Unmapped (Signed)
Formatting of this note might be different from the original.  Goals: back to baseline    Identify possible barriers to meeting goals/advancing plan of care: none    Stability of the patient: Moderately Stable - Low risk of patient condition declining or worsening    End of Shift Summary: 11L HFNC salter when not on BiPAP. Sats >92% while on either. No issues with BiPAP other than patient prefers to have frequent breaks for drinks/snacks. Lungs diminished. NSR. Pain being adequately controlled with PRN percocet. Using bedpan to void. Call light within reach.     Problem: PAIN - ADULT  Goal: Verbalizes/displays adequate comfort level or baseline comfort level  Outcome: Progressing    Problem: INFECTION - ADULT  Goal: Absence of infection during hospitalization  Outcome: Progressing  Goal: Absence of fever/infection during anticipated neutropenic period  Outcome: Progressing    Problem: SAFETY ADULT - FALL  Goal: Free from fall injury  Outcome: Progressing    Problem: DISCHARGE PLANNING  Goal: Discharge to home or other facility with appropriate resources  Outcome: Progressing    Problem: Knowledge Deficit  Goal: Patient/family/caregiver demonstrates understanding of disease process, treatment plan, medications, and discharge instructions  Outcome: Progressing    Problem: Hemodynamic Status  Goal: Patient's vitals signs are stable  Outcome: Progressing    Problem: Excessive Fluid Volume  Goal:  Fluid and electrolyte balance are achieved/maintained  Outcome: Progressing    Problem: Inadequate Gas Exchange  Goal: Patient is adequately oxygenated and ventilation is improved  Outcome: Progressing    Problem: Activity Intolerance/Impaired Mobility  Goal: Mobility/activity is maintained at optimum level for patient  Outcome: Progressing    Problem: Nutrition  Goal: Nutritional status is improving  Outcome: Progressing    Problem: Anxiety  Goal: Anxiety is at manageable level  Outcome: Progressing    Problem: Inadequate  Coping  Goal: Demonstrates ability to cope effectively  Outcome: Progressing  Goal: Verbalizes adaptive coping mechanisms  Outcome: Progressing  Goal: Verbalizes personal strengths  Outcome: Progressing    Problem: Inadequate Tissue Perfusion - Arterial  Goal: Tissue perfusion is adequate - arterial  Outcome: Progressing    Problem: Compromised Skin Integrity  Description: Use this problem when pressure ulcers are classified as stage I or II.  Goal: Nutritional status is improving  Outcome: Progressing  Goal: Skin Integrity is Maintained or Improved  Outcome: Progressing  Goal: Fluid and electrolyte balance are achieved/maintained  Outcome: Progressing    Problem: Insufficient Nutritional Intake  Goal: Mobility/activity is maintained at optimum level for patient  Outcome: Progressing  Goal: Patient's nutritional intake is adequate  Outcome: Progressing    Problem: Excessive Nutritional Intake  Goal: Mobility/activity is maintained at optimum level for patient  Outcome: Progressing  Goal: Patient will decrease calorie consumption  Outcome: Progressing    Problem: Potential for Infection  Goal: Remains infection free  Outcome: Progressing    Problem: Glucose Imbalance  Goal: Clinical indication of glucose balance is achieved  Outcome: Progressing  Goal: Patient's discharge needs are met  Outcome: Progressing    Problem: Altered Mental Status-Delirum/At Risk for Delirium  Goal: Severity of delirium will not progress  Outcome: Progressing  Goal: Prevention of Delirium  Outcome: Progressing    Problem: Potential for Compromised Skin Integrity  Goal: Nutritional status is improving  Outcome: Progressing  Goal: Skin Integrity is Maintained or Improved  Outcome: Progressing    Problem: Urinary Incontinence  Goal: Perineal skin integrity is maintained or improved  Outcome: Progressing  Electronically signed by Serita Butcher, RN at 06/21/2021  3:29 AM EST

## 2021-06-22 NOTE — Unmapped (Signed)
Formatting of this note might be different from the original.  Goals: to get home      Identify possible barriers to meeting goals/advancing plan of care:     Stability of the patient: Moderately Stable - Low risk of patient condition declining or worsening    End of Shift Summary: up in room, gait steady, appetite good for breakfast, asking for sugar "dont like substitutes", educated on CC diet, up to bsc, asking for pain medication for legs    Electronically signed by Zorita Pang, RN at 06/22/2021 10:20 AM EST

## 2021-06-22 NOTE — Unmapped (Signed)
Formatting of this note might be different from the original.  Goals: free from infection      Identify possible barriers to meeting goals/advancing plan of care: none    Stability of the patient: Moderately Stable - Low risk of patient condition declining or worsening    End of Shift Summary: Pt with oxygen on at 4l/min via nc. Pt was medicated with 1 percocet earlier in the shift for c/o various aches and pains. The patient doesn't have any complaints at this time.  The call bell is within reach and the bed alarm is on.  Will continue to monitor.     Electronically signed by Voncille Lo, RN at 06/22/2021  1:20 AM EST

## 2021-06-22 NOTE — Progress Notes (Signed)
Formatting of this note might be different from the original.  FENA calculated, 3.4%,intrinsic etiology, concerning of initial AKI resulting in ATN, will continue to monitor renal function and repeat BMP tomorrow     Dr Wilmon Pali   Electronically signed by Gae Gallop, MD at 06/22/2021  2:25 PM EST

## 2021-06-22 NOTE — Progress Notes (Signed)
Formatting of this note is different from the original.    General Progress Note  Chief Complaint:  Headache body aches      Subjective   -upon seeing the patient this morning she is on 4 L nasal cannula overnight, while eating breakfast at bedside she has her oxygen removed, does not appear to be in any respiratory distress, lungs have crackles in the bases but otherwise she is doing well.  Tolerating her food well no fever no chills no significant shortness of breath or chest pain.    Review of Systems  All other review of systems negative      Physical Exam  GEN: awake, alert, on 4L NC, in no resp distress  CV: RRR, S1S2, no M/R/G  LUNGS:Diminished lung sounds, crackles at bases  GI: +BS, S/NT/ND  NEURO: CN 2-12 intact bilaterally, no motor/sensory deficits noted  HENT: Normocephalic, atraumatic, MMM  EYES: EOMI, Sclera nonicteric, PERRL  SKIN: No rashes or ecchymoses noted  EXTREMITIES: No edema noted  PULSES: 2+ B/L radial, DP, and PT    Allergies:  Phenobarbital, Ace inhibitors, Contrast [iodinated contrast media], Ibuprofen, Lamotrigine, Penicillins, Pregabalin, Shellfish (or derived products), Strawberry, Sulfa antibiotics, Sulfamethoxazole-trimethoprim, and Lactose intolerance (gi)    Medications:  Scheduled:  allopurinol, 100 mg, Oral, BID  amitriptyline, 50 mg, Oral, Bedtime  aspirin, 81 mg, Oral, Daily  atorvastatin, 40 mg, Oral, Daily  doxycycline hyclate, 100 mg, Oral, BID  fluticasone furoate-vilanterol, 1 puff, Inhalation, Daily  heparin (porcine), 5,000 Units, Subcutaneous, Q12 Hrs [place]  insulin lispro, 0-6 Units, Subcutaneous, 4x daily meals HS  predniSONE, 40 mg, Oral, Daily    Infusions:  dextrose, 100 mL/hr    PRN:  ?  acetaminophen  ?  bisacodyl  ?  dextrose **OR** dextrose  ?  dextrose  ?  docusate sodium  ?  glucagon (human recombinant)  ?  glucose  ?  glucose  ?  ipratropium-albuterol  ?  naloxone  ?  ondansetron ODT  ?  oxyCODONE-acetaminophen  ?  tap water    EKG:  EKG Stress Test Results  (Last 7 days)     Procedure Component Value Units Date/Time    EKG [[id number]] Resulted: 06/20/21 1002    Order Status: Completed Updated: 06/20/21 1436    Narrative:      Ordered by an unspecified provider.       EKG Results (last 7 days)     ** No results found for the last 168 hours. **       EKG results have been reviewed by myself as of June 22, 2021    Pertinent Laboratory Results:    Complete Metabolic Profile  Results from last 7 days   Lab Units 06/22/21  0820 06/21/21  0527   SODIUM mmol/L 137 135   POTASSIUM mmol/L 4.8 4.6   CHLORIDE mmol/L 106 106   CO2 mmol/L 21* 20*   ANION GAP mmol/L 10 9   BUN/CREAT RATIO  23.7* 18.0   BUN mg/dL 84* 61*   CREATININE mg/dL 3.55* 3.38*   GLUCOSE mg/dL 121* 141*   CALCIUM mg/dL 8.4 8.1*   TOTAL PROTEIN gm/dL  --  6.0*   ALBUMIN gm/dL 3.2* 3.1*   GLOBULIN, TOTAL gm/dL  --  2.9   A/G RATIO   --  1.1   ALK PHOS U/L  --  159*   AST U/L  --  15   ALT IU/L  --  13   BILIRUBIN TOTAL mg/dL  --  0.5       Pertinent Imaging Results:  Incomplete F Codes     No Incomplete F-Codes Found     Completed F Codes     No Incomplete F-Codes Found       Vital Signs:   Last 24h Min/Max     Temp 97.6 F (36.4 C)  Temp  Min: 97 F (36.1 C)  Max: 97.7 F (36.5 C)   HR 72  Pulse  Min: 72  Max: 82   RR 20  Resp  Min: 20  Max: 22   BP 142/74  BP  Min: 108/76  Max: 142/74   SpO2 95 % (06/22/21 0626)  SpO2  Min: 95 %  Max: 96 %   $ Oxygen Therapy: Supplemental oxygen (06/22/21 0757)  O2 Delivery Method: Nasal cannula (06/22/21 0757)  O2 Flow Rate (L/min): 4 L/min (06/22/21 0757)    Intake/Output:    Intake/Output Summary (Last 24 hours) at 06/22/2021 1001  Last data filed at 06/21/2021 1225  Gross per 24 hour   Intake 480 ml   Output --   Net 480 ml     Assessment/Plan     Principal Problem:    Acute on chronic respiratory failure with hypoxia and hypercapnia (CMS/HCC)  Active Problems:    Chronic kidney disease, stage III (moderate) (CMS/HCC)    Chronic, continuous use of opioids    Chronic  prescription benzodiazepine use    Polycythemia    HPI  Lindsey Pyka Turneris a 71 y.o.femalewith a past medical history of chronic kidney disease, diet-controlled diabetes mellitus type 2, chronic pain syndrome on chronic opiates benzodiazepines and Lyrica, essential hypertension, polycythemia, coronary artery disease, debility, and history of opiate overdose who presents to [facility] for evaluation for headache and body aches, complicated by AMS, hypoxia, req 10L NC, ABG showed met acidosis, and hypoxia, placed on BIPAP and given narcan and brought to our ICU, she gained consciousness later in the day and now is at her baseline.  She mentioned she had a horrible HA and that is what brought her to the ER, she does not recall taking any pain meds she has been prescribed at home.  She then was transferred to the medical floors, has been weaned down currently on 4 L nasal cannula.  Continuing empiric prednisone, doxycycline, COPD inhalers for acute exacerbation of COPD.    Assessment and Plan:    Acute on chronic respiratory failure with hypoxia and hypercapnia (CMS/HCC)  Admit to intensive care unit   Multifactorial most likely to do with patient's severe COPD and pulmonary hypertension and acute COPD exacerbation will screen for viral respiratory panel pathogen negative for COVID-19 start doxycycline Solu-Medrol 40 q.6  Will order BiPAP repeat ABG   Unfortunately patient is noncompliant continues to take Lyrica, Percocet as well as clonazepam with diminished renal function.  Will perform transthoracic echocardiogram  Vital signs q.1h   Follow on telemetry   Polycythemia is likely complicating patient's chronic renal failure and severe pulmonary hypertension and COPD    2/11: bipap at night seems to help patient with her resp status, she is on 10 L NC during the day, appears to have 5 L O2 concentrator at home, at this time I changed her steroids to prednisone 31m daily, will cont COPD  treatment, pending echo, patient is stable for floors, will cont to wean O2 as tolerated, PT ordered   -somnolence seems to be related to substance use from medication overdose at home   -  cont doxyxycline for COPD exacerbation, transitioned to PO today     2/12:  Hypoxia improving, currently on 4 L, continue prednisone for total 5 days, stable from an oxygenation standpoint, physical therapy pending, continue doxycycline, COPD inhalers      Chronic kidney disease, stage III (moderate) (CMS/HCC)  Acute on chronic renal failure   Perform renal ultrasound   Avoid NSAIDs and nephrotoxic agents   Check urine electrolytes  Start gentle IVF   Tract intake and output daily weights    2/11: Cr improved with IVF hydration, likely pre renal from hypovolemia,     2/12:  Creatinine 3.5, slightly increased from yesterday, patient's previous creatinine near 2, will order urine sodium and creatinine to evaluate further    Chronic prescription benzodiazepine use  Complicating risk factor considering the patient's advanced age risk for mortality due to renal failure and multiple chronic problems such as COPD and noncompliance    2/12: Resumed patient's duloxetine, topiramate, currently holding any benzodiazepines in the setting of overdose    Polycythemia  Heparin DVT prophylaxis   Start enteric-coated daily aspirin.    Chronic, continuous use of opioids  Concurrently prescribed with benzodiazepines   Patient has a history of opiate overdose these medications continue to be prescribed to a patient with diminished renal function this increases her risk of mortality. Discussed with patient though unclear ifshe comprehends. Has had prev hist of overdose    2/12: Holding opioids and benzos in the setting of possible overdose      DVT prophylaxis  Heparin SQ    CODE STATUS   FULL CODE    I certified this patient meets inpatient criteria given without intervention medical management stabilization of the above the patient can be  met with significant complication to include mortality.      Face to Face Patient Counseling / Coordinating Care >50% of Encounter Time:[facility] F to F Enc Time (YES/NO): Yes  Total Encounter Time (in minutes):I spent a total of 30 minute seeing this patient coordinating care and admission on this day.    .      Electronically signed by Henrietta Dine, MD at 06/22/2021 10:07 AM EST

## 2021-06-23 NOTE — Unmapped (Signed)
Formatting of this note might be different from the original.  Goals:       Identify possible barriers to meeting goals/advancing plan of care:     Stability of the patient: Moderately Stable - Low risk of patient condition declining or worsening    End of Shift Summary: Patient resting in bed with call bell and possessions within reach, voices no complaints at this time.    Electronically signed by Nelma Rothman, RN at 06/23/2021  7:10 AM EST

## 2021-06-23 NOTE — Unmapped (Signed)
Formatting of this note might be different from the original.  Goals:    "To get better"    Identify possible barriers to meeting goals/advancing plan of care: none    Stability of the patient: Moderately Stable - Low risk of patient condition declining or worsening    End of Shift Summary: Patient admitted 2/10 with respiratory failure. AAOx4, up with assistance. SR on tele. O2 4L, baseline. BLE edema. FS ACHS. Fall risk interventions in place. Bedside table and call bell within reach.     Electronically signed by [place] Sokel, RN at 06/23/2021  7:58 AM EST

## 2021-06-23 NOTE — Discharge Summary (Signed)
Formatting of this note is different from the original.  Images from the original note were not included.  Discharge Summary  Patient: Lindsey Yang [id number]  Admitting Provider: Teddy Spike, MD  Discharge Provider: Henrietta Dine, MD  Primary Care Physician at Discharge: Shawnie Dapper, MD [phone]    Admission Date: 06/20/2021     Discharge Date: 06/23/2021    Discharge Diagnosis:  Principal Problem:    Acute on chronic respiratory failure with hypoxia and hypercapnia (CMS/HCC) POA: Yes  Active Problems:    Chronic kidney disease, stage III (moderate) (CMS/HCC) POA: Yes    Chronic, continuous use of opioids POA: Yes    Chronic prescription benzodiazepine use POA: Not Applicable    Polycythemia POA: Yes  Resolved Problems:    None    Medications:     Patient's Prior To Admission/Home Medication List at Time of Discharge     START taking these medications      Instructions   doxycycline hyclate 100 MG capsule  Commonly known as: VIBRAMYCIN   100 mg, Oral, 2 times daily    predniSONE 20 MG tablet  Commonly known as: DELTASONE  Start taking on: June 24, 2021   40 mg, Oral, Daily      CHANGE how you take these medications      Instructions   furosemide 20 MG tablet  Commonly known as: LASIX  What changed: when to take this   40 mg, Oral, 2 times daily      CONTINUE taking these medications      Instructions   allopurinol 100 MG tablet  Commonly known as: ZYLOPRIM   100 mg, Oral, 2 times daily    amitriptyline 50 MG tablet  Commonly known as: ELAVIL   100 mg, Oral, Twice daily    amLODIPine 5 MG tablet  Commonly known as: NORVASC   5 mg, Oral, Daily    aspirin 81 MG chewable tablet   81 mg, Oral, Daily    atorvastatin 40 MG tablet  Commonly known as: LIPITOR   40 mg, Oral, Daily, For cholesterol    calcium citrate-vitamin D 315-200 MG-UNIT per tablet  Commonly known as: CITRACAL+D   1 tablet, Oral, Daily    cetirizine 10 MG tablet  Commonly known as: ZyrTEC   10 mg, Oral, At Bedtime    Cymbalta  60 MG capsule  Generic drug: DULoxetine   60 mg, Oral, 2 times daily    diclofenac 1 % gel  Commonly known as: VOLTAREN   4 g, Topical, 4 times daily PRN    dicyclomine 10 MG capsule  Commonly known as: BENTYL   No dose, route, or frequency recorded.    EpiPen 2-Pak 0.3 mg/0.84m injection syringe  Generic drug: EPINEPHrine   0.3 mL, Intramuscular, As directed    fluticasone furoate-vilanterol 100-25 MCG/ACT inhaler  Commonly known as: BREO ELLIPTA   1 puff, Inhalation, Daily    fluticasone-salmeterol 250-50 MCG/ACT diskus inhaler  Commonly known as: ADVAIR DISKUS   1 puff, Inhalation, 2 times daily, Rinse mouth with water after use.  Don't swallow.    GoodSense Nicotine 2 MG gum  Generic drug: nicotine polacrilex   No dose, route, or frequency recorded.    Lidocaine (Anorectal) 5 % cream   1 application, Apply externally, 3 times daily    metoprolol succinate XL 100 MG 24 hr tablet  Commonly known as: TOPROL-XL   100 mg, Oral, Daily    mirtazapine 15 MG tablet  Commonly  known as: REMERON   15 mg, Oral, At bedtime PRN    naloxone 4 MG/0.1ML liquid  Commonly known as: NARCAN   1 spray, One Nostril, As needed, May repeat every 2 to 3 minutes in alternating nostrils until medical assistance becomes available.     * oxyCODONE-acetaminophen 10-325 MG per tablet  Commonly known as: PERCOCET   0.5 tablets, Oral, 2 times daily PRN    * oxyCODONE-acetaminophen 7.5-325 MG per tablet  Commonly known as: PERCOCET   No dose, route, or frequency recorded.    Oxygen Therapy   2 L, Inhalation, As needed    potassium chloride 10 MEQ CR tablet  Commonly known as: K-DUR,KLOR-CON   10 mEq, Oral    ProAir HFA 108 (90 Base) MCG/ACT inhaler  Generic drug: albuterol HFA   1 puff, Inhalation, Every 4 to 6 hours PRN, For shortness of breath    SUMAtriptan 50 MG tablet  Commonly known as: IMITREX   50 mg, Oral, Once as needed, May repeat dose once in 2 hours if no relief.  Do not exceed 2 doses in 24 hours.     topiramate 25 MG  tablet  Commonly known as: TOPAMAX   25 mg, Oral    triamcinolone 0.1 % cream  Commonly known as: KENALOG   No dose, route, or frequency recorded.       * This list has 2 medication(s) that are the same as other medications prescribed for you. Read the directions carefully, and ask your doctor or other care provider to review them with you.         STOP taking these medications    clonazePAM 1 MG tablet  Commonly known as: KlonoPIN    metoprolol tartrate 100 MG tablet  Commonly known as: LOPRESSOR        Allergy(ies):  Phenobarbital, Ace inhibitors, Contrast [iodinated contrast media], Ibuprofen, Lamotrigine, Penicillins, Pregabalin, Shellfish (or derived products), Strawberry, Sulfa antibiotics, Sulfamethoxazole-trimethoprim, and Lactose intolerance (gi)    Hospital Course:  Lindsey Yang is a 71 y.o.female Lindsey Vanhorne Turneris a 71 y.o.femalewith a past medical history of chronic kidney disease, diet-controlled diabetes mellitus type 2, chronic pain syndrome on chronic opiates benzodiazepines and Lyrica, essential hypertension, polycythemia, coronary artery disease, debility, and history of opiate overdose who presents to [facility] for evaluation for headache and body aches, complicated by AMS, hypoxia, req 10L NC, ABG showed met acidosis, and hypoxia, placed on BIPAP and given narcan and brought to our ICU, she gained consciousness later in the day and returned to her baseline. She mentioned she had a horrible HA and that is what brought her to the ER, she does not recall taking any pain meds she has been prescribed at home.  She then was transferred to the medical floors, had been weaned down currently on 4 L nasal cannula and ultimately room air on February 13th.  It was confirmed that she does have a 5 L O2 concentrator at home when case management spoke to her insurance company.   she was empirically managed with 5 day course of prednisone, doxycycline, and continued on her  COPD inhalers for acute exacerbation of COPD.    Her active issues are listed below     Acute on chronic respiratory failure with hypoxia and hypercapnia   -originally was admitted to the ICU, thought to be multifactorial in the setting of severe COPD, pulmonary hypertension, medication overdose from opioids and benzodiazepines.  She was empirically given Narcan prior  to arrival.  BiPAP was ordered, patient was placed on BiPAP while in the ICU.  She was managed with doxycycline for community-acquired pneumonia, infectious workup including viral panel remained negative, blood cultures no growth to date.  She was given high-dose steroids which was then transitioned to prednisone 40 for a full course of 5 day treatment.  She was then weaned down from BiPAP to nasal cannula transferred to the floor where on February 13th she was saturating 95% on room air.  She does have a 5 L O2 concentrator at home it was encouraged that patient take her oxygen for oxygen to saturate.  She confirmed she is a pulse ox at home.  It appears her baseline may vary between 2-4 L nasal cannula prior to this admission.  She was encouraged to use this oxygen at home however patient has been nonadherent in the past and I am concerned that she will not adequately take her oxygen even if her oxygen desaturates on her monitor.  We will ensure that she is close follow-up with pulmonology prior to discharge.    CKD stage 3, patient presented with acute on chronic renal failure, thought was it was secondary to prerenal hypovolemia, this may have progressed to ATN as patient's fractional excretion of sodium was calculated on February 12th noted to be 3.4% which indicates intrinsic injury.  Patient's previous creatinine was 2, creatinine started to downtrend most recent was 3.4, would recommend follow-up with PCP to re-evaluate renal function panel after discharge     Chronic prescription benzodiazepine and opioid use: Resumed patient's duloxetine,  topiramate, held patient's clonazepam on discharge, recommended she follow up with Dr. Tommie Raymond in 1-2 weeks for possible opioid and benzodiazepine overdose    Consults:   None    Operative Procedures: None    Bedside Procedures: None    Pertinent Laboratory Results:    CBC  Results from last 7 days   Lab Units 06/21/21  0527 06/20/21  0617 06/20/21  0457   WBC k/mcL 5.5  --  7.7   RBC Mil/mcL 4.63  --  5.28   HEMOGLOBIN gm/dL 15.2  --  17.4*   HEMOGLOBIN POC gm/dL  --  18.7*  --    HEMATOCRIT % 46.6  --  53.2*   HEMATOCRIT POC %  --  55.0*  --    PLATELETS k/mcL 162  --  183     Complete Metabolic Profile  Results from last 7 days   Lab Units 06/23/21  0603 06/22/21  0820 06/21/21  0527   SODIUM mmol/L 139   < > 135   POTASSIUM mmol/L 4.8   < > 4.6   CHLORIDE mmol/L 110   < > 106   CO2 mmol/L 20*   < > 20*   ANION GAP mmol/L 9   < > 9   BUN/CREAT RATIO  26.8*   < > 18.0   BUN mg/dL 92*   < > 61*   CREATININE mg/dL 3.43*   < > 3.38*   GLUCOSE mg/dL 77   < > 141*   CALCIUM mg/dL 8.6   < > 8.1*   TOTAL PROTEIN gm/dL  --   --  6.0*   ALBUMIN gm/dL 3.1*   < > 3.1*   GLOBULIN, TOTAL gm/dL  --   --  2.9   A/G RATIO   --   --  1.1   ALK PHOS U/L  --   --  159*   AST  U/L  --   --  15   ALT IU/L  --   --  13   BILIRUBIN TOTAL mg/dL  --   --  0.5    < > = values in this interval not displayed.     Cardiac Enzymes   No results from last 7 days    BNP   No results from last 7 days    Hepatic Function  Results from last 7 days   Lab Units 06/23/21  0603 06/22/21  0820 06/21/21  0527   ALBUMIN gm/dL 3.1*   < > 3.1*   TOTAL PROTEIN gm/dL  --   --  6.0*   BILIRUBIN TOTAL mg/dL  --   --  0.5   ALT IU/L  --   --  13   AST U/L  --   --  15   ALK PHOS U/L  --   --  159*    < > = values in this interval not displayed.     Lipid Panel  No results from last 7 days    Pertinent Imaging Results:  Incomplete F Codes     No Incomplete F-Codes Found     Completed F Codes     No Incomplete F-Codes Found       All X-ray Impressions  Results for  orders placed during the hospital encounter of 06/20/21    XR CHEST 1 VW 06/20/2021 (Final)    Status: Normal    Impression  No acute lung findings.    INTERPRETING PHYSICIAN: Barnabas Lister, MD  WM//Report ID: [id number]    Creation Date: 06/20/21 9:33 am    All CT Impressions  Results for orders placed during the hospital encounter of 06/20/21    CT THORAX WO CONTRAST 06/20/2021 (Final)    Status: Abnormal    Impression  1. Septal thickening with scattered areas of ground-glass opacification throughout the lungs, favored to represent mild pulmonary edema.  2. Cardiomegaly with coronary artery disease.  3. Enlarged main pulmonary artery which can be seen in setting of pulmonary hypertension.    ++++++++++++++++++++++++++++++++++++++++++++++++++++++++++++++++++  +++++CLASS CODE+++++:  P-positive findings +++++ POSITIVE +++++  ++++++++++++++++++++++++++++++++++++++++++++++++++++++++++++++++++    Digital Imaging and Communications in Medicine (DICOM) format image data are available to nonaffiliated external healthcare facilities or entities on a secure, media free, reciprocally searchable basis with patient authorization for at least a 17-monthperiod after this study.    INTERPRETING PHYSICIAN: JRussella Dar MD  JDL//Report ID: [id number]   Creation Date: 06/20/21 6:59 am    CT HEAD WO CONTRAST 06/20/2021 (Final)    Status: Normal    Impression  No CT evidence of acute intracranial finding.  No significant interval change.    Digital Imaging and Communications in Medicine (DICOM) format image data are available to nonaffiliated external healthcare facilities or entities on a secure, media free, reciprocally searchable basis with patient authorization for at least a 135-montheriod after this study.    INTERPRETING PHYSICIAN: JoRussella DarMD  JDL//Report ID: [id number]  Creation Date: 06/20/21 6:52 am    All US[place]oreampressions  Results for orders placed during the hospital encounter of 06/20/21    US[place]ENAL 06/20/2021  (Final)    Status: Normal    Impression  No collecting system dilatation.  Bilateral medical renal disease.    INTERPRETING PHYSICIAN: WiBarnabas ListerMD  WM//Report ID: [id number]  Creation Date: 06/20/21 5:00 pm    All MRI  Impressions  No results found for this visit on 06/20/21.    Other Results:  ECHO Results This Encounter     None               Physical Condition at Discharge:  Discharge Condition: good    Vitals BP (!) 168/92 (BP Location: Right arm)   Pulse 85   Temp 97.7 F (36.5 C) (Oral)   Resp 20   Ht '4\' 11"'  (1.499 m)   Wt 161 lb 8 oz (73.3 kg)   SpO2 92%   BMI 32.62 kg/m     Physical Exam  GEN: awake, alert, on room air,  in no resp distress  CV: RRR, S1S2, no M/R/G  LUNGS:Diminished lung sounds, crackles at bases  GI: +BS, S/NT/ND  NEURO: CN 2-12 intact bilaterally, no motor/sensory deficits noted  HENT: Normocephalic, atraumatic, MMM  EYES: EOMI, Sclera nonicteric, PERRL  SKIN: No rashes or ecchymoses noted  EXTREMITIES: No edema noted  PULSES: 2+ B/L radial, DP, and PT    Discharge Instructions:  Activity Instructions     Post-Discharge Activity: Normal activity as tolerated.      Normal activity as tolerated.     Diet Instructions     Adult Discharge Diet Heart Healthy      Diet Type: Regular    Sodium Restriction: Heart Healthy     Other Instructions     Ambulatory Referral to [place]      Date of Face to Face Encounter (If encounter/office visit has not occurred, then select an approximate future date that is within 30 days): 06/23/2021    Homebound Reason: patient has respiratory condition and must limit exposure to temperature extremes    Skilled Disciplines Requested:  Skilled Nursing  Physical Therapy      Skilled Nursing Clinical Findings: Patient needs assessment of their condition and observation for changes that could necessitate modification or addition or medication treatment. Comment - Opioid and benzo medication monitoring    Additional Nursing Orders: Nursing Evaluation     Nursing evaluation for: Assess for Home Health needs    Physical Therapy assessment: Patient has weakness and decreased mobility resulting from a recent injury, illness, or surgery.    Physician to follow patient's care: PCP/Specialist    Provider's Name: HALLS, Sidney Communication Order - if applicable the following will be carried out.: Yes    Wound Care: [place] wound care protocol if applicable    Pain: Monitor and take measures to decrease pain    Pressure Ulcer Prevention: Teach pressure ulcer prevention and implement interventions    Diabetic Foot Care: Assessment and teaching    Pressure Ulcer Treatment: Will be based on principles of moist wound healing    Fall Prevention: Assessment and teaching    Drug Interactions: MD is aware of the drug interactions within the patient's current medication list    Depression: Monitoring and current treatment    Discharge instructions      Please ensure you follow up with your pain specialist Dr. Tommie Raymond, as well as her primary care doctor.  It appears your altered mental status this admission may have been due to too much pain medication being given.  You have been managed for respiratory distress he will be discharged with steroids as well as doxycycline for pneumonia.  Please ensure you complete these remaining days of treatment.    Not on Warfarin. No warfarin follow up needed.  Not on Warfarin. No warfarin follow up needed.         Follow Up Instructions     Follow-up with primary physician (PCP)      Targeted follow up time: 1 - 2 weeks    Instructions for follow-up: Hospital follow-up    Follow-up with provider      Follow up provider: Nyra Capes    Targeted follow up time: 1 - 2 weeks    Instructions for follow-up: Hospital follow up, concern of pain/ sedative medication overdose    Follow-up with provider      Follow up provider: Sherley Bounds F    Targeted follow up time: 1 - 2 weeks    Instructions for follow-up:  Hospital follow-up for COPD       Scheduled Outpatient Follow-Up:     Provider Department    06/26/2021 [facility]  Arrive at: [facility] Registration Desk    08/05/2021 Daivd Council, NP [facility]  Arrive at: [facility]    08/25/2021 [facility] [place] at: [facility] Registration Desk       Anticipated Discharge Disposition:  Home-Health Care Svc  Code Status at Discharge: Full Code    CC: Shawnie Dapper, MD [phone]    Total Encounter Time (in minutes): 35 minutes    Face to Face Patient Counseling / Coordinating Care >50% of Encounter Time: RHS F to F Enc Time (YES/NO): Yes      Electronically signed by Henrietta Dine, MD at 06/23/2021  9:58 AM EST

## 2021-06-23 NOTE — Unmapped (Signed)
Formatting of this note might be different from the original.  Goals: free from infection      Identify possible barriers to meeting goals/advancing plan of care: none    Stability of the patient: Moderately Stable - Low risk of patient condition declining or worsening    End of Shift Summary: The patient has been medicated with 1 percocet for c/o pain earlier in the shift. The patient doesn't have any complaints at this time.  The call bell is within reach and the bed alarm is on.  Will continue to monitor.     Electronically signed by Voncille Lo, RN at 06/23/2021  4:04 AM EST

## 2021-06-23 NOTE — Progress Notes (Signed)
Formatting of this note might be different from the original.  DC HOME WITH [facility]  Electronically signed by Silvio Pate, RN at 06/23/2021 10:58 AM EST

## 2021-06-23 NOTE — Unmapped (Signed)
Formatting of this note might be different from the original.    Problem: Inadequate Gas Exchange  Goal: Patient is adequately oxygenated and ventilation is improved  Outcome: Progressing    Electronically signed by Sedalia Muta, RRT at 06/23/2021  7:11 AM EST

## 2021-09-05 NOTE — Other (Signed)
Formatting of this note might be different from the original.  Discuss at visit next week    Dr.  Susy Frizzle  Electronically signed by Antony Madura, MD at 09/05/2021  4:44 PM EDT

## 2021-10-27 NOTE — Progress Notes (Signed)
Formatting of this note might be different from the original.  Paged regarding low systolic bp. Heart rate has been normal. No new complaints per patient. Her MAPs have all been greater than 65. She mentating without any issues. Suspect that patient lives on the slightly low side for her bp. Will increase her midodrine dose to 10 TID. Upgrade her to stepdown. Will continue to monitor closely. If any further issues or concerns please page trauma.     Eudelia Bunch   Electronically signed by Eudelia Bunch, MD at 10/27/2021  9:36 PM EDT

## 2021-10-27 NOTE — ED Notes (Signed)
Formatting of this note is different from the original.     10/27/21 1356   Glasgow Coma Scale   Best Eye Response 4-->(E4) spontaneous   Best Motor Response 6-->(M6) obeys commands   Best Verbal Response 5-->(V5) oriented   Glasgow Coma Scale Score 15   Assessment Qualifiers no eye obstruction present   Neuro   Pupil Size: Left 3 mm   Pupil Size: Right 3 mm   Pupils Equal   Left Pupil Reaction Brisk   Right Pupil Reaction Brisk   Motor Assessment WDL   Sensory Assessment WDL   Emotional State   Emotional State Assessment Calm   Suspect N/A   Head   Head Normal   Ears Normal   Eyes Normal   Nose Normal   Mouth Normal   Neck   Trachea Midline   Neck Veins WDL   C-Spine Tender   Chest   All Lung Fields Breath Sounds Anterior:;Posterior:;clear;equal bilaterally   Chest Expansion Equal   Flail Chest N/A   Chest Wall Tender  (R Sided)   Abdomen   Abdomen Soft;Tender  (R sided abdominal tenderness)   Bowel Sounds Present X4   Pelvis   Assessment Stable   GI/GU   Urine N/A   Stool N/A   Meatus Normal   Sphincter Tone Present   GYN   Assessment N/A   Arms   Movement Normal   Sensation Normal   Pulses Present  (2+)   Arms trauma deficits no obvious deficits noted   Legs   Movement Normal   Sensation Normal   Pulses Present  (2+ DP)   Legs trauma deficits no obvious deficits noted   Back   Assessment Tender   Log roll w/ c-spine? Yes   C-spine cleared? No       Electronically signed by Jonn Shingles, RN at 10/27/2021  1:59 PM EDT

## 2021-10-27 NOTE — ED Notes (Signed)
Formatting of this note is different from the original.     10/27/21 1351   Trauma Start   Trauma Type Alpha   Upgraded Trauma Type Upgraded Alpha  (@1351 )   Upgraded Trauma Time: 1351   Trauma Called 1328   Patient Arrival 1340       Electronically signed by , RN at 10/27/2021  1:51 PM EDT

## 2021-10-27 NOTE — Progress Notes (Signed)
Formatting of this note might be different from the original.  Will aim to maintain a MAP>65 as opposed to systolic goal of greater than 812.     Eudelia Bunch   Electronically signed by Eudelia Bunch, MD at 10/27/2021  9:39 PM EDT

## 2021-10-27 NOTE — ED Notes (Signed)
Formatting of this note might be different from the original.  Dr. Leanord Asal at bedside.    1L of NS hung at this time.     Electronically signed by Jonn Shingles, RN at 10/27/2021  4:15 PM EDT

## 2021-10-27 NOTE — ED Notes (Signed)
Formatting of this note is different from the original.      Emergency Department Shock Index  (Shock Index = Heart Rate/Systolic BP)    HR=   70    SBP=  96    SI =   0.73      Is the Shock Index > 1? No   ______________________________________________________________________  If the Shock Index is greater than 1, promptly notify ED Attending or Trauma Surgeon.    MD notified:  Yes    MD name: Dr. Daphine Deutscher    Time MD notified: 1340    Massive Transfusion Protocol initiated?  No    Electronically signed by Jonn Shingles, RN at 10/27/2021  6:12 PM EDT

## 2021-10-27 NOTE — ED Notes (Signed)
Formatting of this note might be different from the original.  Transferred pt to [facility] approx 2240, all questions answered , pt Bp and Map within acceptable range. Informed her of the elevated trop as well. Transferred pt with tech to the floor.     Lelon Perla  Electronically signed by Rosalie Doctor, RN at 10/27/2021 11:59 PM EDT

## 2021-10-27 NOTE — Progress Notes (Signed)
Formatting of this note is different from the original.  Images from the original note were not included.  Pt arrived in rm 364 from ED. Pt able to ambulate from the stretcher to the bathroom and back to the bed. Pt assessed and oriented to room. A/Ox 4 with some mild memory loss from the accident itself.      10/27/21 2240   Vital Signs   Temp 97.5 F (36.4 C)   Temp Source Oral   Heart Rate 72   Pulse 72   Resp 12   BP 123/77   BP Mean 93 MM HG   SpO2 95 %   Device (Oxygen Therapy) nasal cannula   Flow (L/min) (Oxygen Therapy) 3   VSA Score ([employer]) 2       Electronically signed by Ferol Luz, RN at 10/27/2021 11:21 PM EDT

## 2021-10-27 NOTE — ED Notes (Signed)
Formatting of this note is different from the original.  Critical Value/Critical Test Notification     Received at: 1812  Patient identified:  Name: Lindsey Yang:  MRN: [id number]  [id number]      Critical result reported: troponin 65  Critical result reported by: Lab  Critical result read back/confirmed: YES  Critical result received by: Raoul Pitch, RN     Critical result treated per protocol/parameter orders: N/A    MD/Licensed Caregiver notified of critical result:   MD notified at: Leanord Asal paged   MD/Licensed Caregiver read back/confirmed: N/A  Electronically signed by Jonn Shingles, RN at 10/27/2021  6:14 PM EDT
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
## 2021-10-27 NOTE — ED Notes (Signed)
Formatting of this note might be different from the original.  Pt back to room from CT.    Dr. Theresa Mulligan at bedside assessing patient.   Electronically signed by Jonn Shingles, RN at 10/27/2021  2:27 PM EDT
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
## 2021-10-27 NOTE — ED Notes (Signed)
Formatting of this note is different from the original.  PIV placed successfully with ultrasound guidance using:  []  1.88 inches Insyte /[x] 1.75 inches Nexiva / [] Other    [] Right FA              [] Cephalic vein  [] Right AC              [] Basilic vein  [] Left FA                 [] Brachial vein  [x] Left AC                [] Radial vein  [] Right Upper Arm  [] Left Upper Arm  [] Other:     Location: See LDA flowsheet or insert below.    Vein Depth: [x] <0.5 cm  [] >0.5 cm-1.0 cm  [] >1.0-1.2cm; dark non-pulsating blood return.     Sterile Probe Cover Used: [x] Yes    Were Labs Obtained: [] Yes   [x] No    # of attempts: [x] 1   [] 2    Complications During Procedure: None, pt tolerated well. Flushes easily and pulls back blood.    EBL: 0cc    Electronically signed by at 10/27/2021  9:56 PM EDT
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
## 2021-10-27 NOTE — ED Notes (Signed)
Formatting of this note is different from the original.     10/27/21 2140   PIV: 10/27/21 2140 18 gauge Antecubital Fossa Left   Placement Date/Time: 10/27/21 2140   Pre-existing LDA: No  Local Anesthetic: None  Catheter Type: Nexiva  Needle Gauge: 18 gauge  Location: Antecubital Fossa  Orientation: Left  Patient Tolerance: Insertion: tolerated well   Site Assessment WDL   Line status Saline lock   Dressing Type Transparent / Semipermeable   Dressing Status WDL   Date on Dressing  10/27/21   Securement site guard in place       Electronically signed by Leanora Cover at 10/27/2021  9:56 PM EDT

## 2021-10-27 NOTE — ED Notes (Signed)
Formatting of this note might be different from the original.  Bedside shift report with [facility] of pt relinquished at this time.     Electronically signed by Jonn Shingles, RN at 10/27/2021  7:42 PM EDT
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
## 2021-10-28 NOTE — Progress Notes (Signed)
Formatting of this note might be different from the original.  Has been up ambulating with no dizziness, syncope or SOB. Abdominal exam benign. D/C when medical evaluation/ workup complete.  Electronically signed by Florene Route, MD at 10/28/2021 10:18 AM EDT

## 2021-10-28 NOTE — Progress Notes (Signed)
Formatting of this note might be different from the original.  Chaplain Assessment of Patient    Subjective- Chaplain made Initial  visit with Lindsey Yang based on Nurse referral. Lindsey Yang welcomed visit.    Objective- During the visit, the patient requested or expressed need for Prayer. The Chaplain provided the following pastoral interventions: Supportive pastoral conversation and Prayer.     Assessment- The patient's current use of spiritual resources shows Indicated strong use of spirituality for coping. Based on intervention(s) provided, the patient Thanks expressed and Appeared to be well engaged    Plan- No follow-up required, as presently assessed     Tonita Cong  Associate Chaplain  [phone]  C [phone]    Patient contact completed with proper PPE;   ie face mask, N-95, gloves and gown as needed.     Electronically signed by Tonita Cong at 10/28/2021  3:54 PM EDT
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
## 2021-10-28 NOTE — Progress Notes (Signed)
Formatting of this note is different from the original.     10/28/21 1702   Transportation   Patient discharge planned via: Ambulance   Ambulance Company Name [facility]   Transport Phone Number [phone]   Transport Date 10/28/21   Scheduled Transport Time 2030     Hassell Done, MSW  Inpatient Case Management      Electronically signed by Bartholomew Boards, MSW at 10/28/2021  5:03 PM EDT

## 2021-10-28 NOTE — Progress Notes (Signed)
Formatting of this note might be different from the original.  Patient requesting home meds restarted, specifically concerned about Cymbalta and Lasix. Also in avoidance of IV pain meds as pain in only 4-5, offered the PRN Norco. Patient states she doesn't like the Norco because it upsets her stomach and is requesting Percocet instead. Leanord Asal, Florentina Addison MD paged [phone]) and awaiting call back.  Electronically signed by Oneal Deputy, RN at 10/28/2021 10:22 AM EDT

## 2021-10-28 NOTE — Case Communication (Signed)
Formatting of this note might be different from the original.  Patient family unable to provide transportation    MSW arrange transportation through [employer] [phone] Trip # [phone] @ 3:30pm    Ed Blalock, MSW  Supervisee In Social Work  Discharge Planner       Electronically signed by Vickki Muff, MSW at 10/28/2021  2:37 PM EDT

## 2021-10-28 NOTE — Progress Notes (Signed)
Formatting of this note might be different from the original.  Discharge order noted. Discharge instructions and prescriptions provided to patient by off going RN. Escorted from unit with medical transport team  via stretcher in stable condition. Discharged to home.  Electronically signed by Jackolyn Confer, RN at 10/28/2021  8:58 PM EDT

## 2021-10-28 NOTE — Case Communication (Signed)
Formatting of this note might be different from the original.  ICM ON CALL notes patient with dc orders for TODAY.  ICM not flagged, transition plan is Home without any home health or DME needs.      Hassell Done, MSW  Inpatient Case Management      Electronically signed by Bartholomew Boards, MSW at 10/28/2021  5:06 PM EDT
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
## 2021-12-22 NOTE — Telephone Encounter (Signed)
Formatting of this note might be different from the original.  Patient to do physical therapy closer to home on the [employer] signed by Charmian Muff, Mya at 12/22/2021 10:41 AM EDT

## 2022-01-09 NOTE — Progress Notes (Signed)
Formatting of this note is different from the original.  Chief Complaint   Patient presents with   ? Foot Screening     This diabetic patient familiar to my service was seen in [facility] in the office based setting.  The patient has visible dry skin and peeling skin on the plantar and or dorsum of the feet.    Physical examination reveals toenails grossly thickened, yellow, dystrophic, incurvated, and dystrophic.  The skin is dry but grossly intact, some peeling skin noted on the dorsal and plantar surface bilaterally.  There are no obvious ulcerations or signs of infection.  Vascular exam reveals pedal pulses which are found to be palpable DP and PT both 1/4, feet are warm to touch and symmetrical and color and temperature.  Neurological exam is significant for sharp and dull sensations intact, Babinski sign negative, reflexes intact.  Musculoskeletal exam is noncontributory.    HPI-  patient presents complaining of dry skin bilateral feet.  The location is the plantar aspect of the feet and the heels.  The duration has been chronic and longstanding for the past several months.  Severity is mild to moderate.  Aggravating conditions include lack of moisturizer and not wearing socks.    Patient ID: Lindsey Yang is a 71 y.o. female.    Vitals:    01/09/22 1046   BP: (!) 124/91   Pulse: 78   Weight: 133 lb (60.3 kg)   PainSc: 0-No pain      Reino Kent, MD    Last seen by PCP:  12/11/21    <no information>    Allergies   Allergen Reactions   ? Phenobarbital Anaphylaxis   ? Ace Inhibitors      Angioedema   Angioedema   Angioedema    ? Contrast [Iodinated Contrast Media] Hives   ? Gabapentin    ? Ibuprofen GI intolerance   ? Lamotrigine      Scratchy throat   Scratchy throat   Scratchy throat    ? Penicillins Hives   ? Pregabalin      Rash, hives   ? Propoxyphene    ? Shellfish (Or Derived Products) Hives   ? Strawberry Hives   ? Sulfa Antibiotics    ? Sulfamethoxazole-Trimethoprim GI intolerance      Other reaction(s): GI intolerance   ? Lactose Intolerance (Gi) GI intolerance     Diarrhea and abdominal pain     Current Outpatient Medications   Medication Sig Dispense Refill   ? albuterol HFA (PROAIR HFA) 108 (90 BASE) MCG/ACT aerosol solution Inhale 1 puff every 4 (four) to 6 (six) hours as needed. For shortness of breath     ? alendronate (FOSAMAX) 70 MG tablet alendronate 70 mg tablet     ? allopurinol (ZYLOPRIM) 100 MG tablet Take 100 mg by mouth 2 (two) times a day  12   ? allopurinol (ZYLOPRIM) 300 MG tablet allopurinol 300 mg tablet     ? amitriptyline (ELAVIL) 50 MG tablet amitriptyline 50 mg tablet     ? amLODIPine (NORVASC) 5 MG tablet amlodipine 5 mg tablet     ? apixaban (Eliquis) 5 MG tablet Eliquis 5 mg tablet     ? aspirin 81 MG EC tablet aspirin 81 mg tablet,delayed release     ? atorvastatin (LIPITOR) 20 MG tablet      ? atorvastatin (LIPITOR) 40 MG tablet atorvastatin 40 mg tablet     ? azelastine (ASTELIN) 0.1 % nasal spray azelastine  137 mcg (0.1 %) nasal spray aerosol     ? Calcium Carb-Cholecalciferol 600-20 MG-MCG tablet      ? Calcium Carbonate-Vitamin D (Calcium-Vitamin D) 600-3.125 MG-MCG tablet as directed po once aday for 90 days     ? clonazePAM (KlonoPIN) 0.5 MG tablet      ? diclofenac (VOLTAREN) 1 % gel Apply 4 g topically 4 (four) times a day as needed     ? dicyclomine (BENTYL) 10 MG capsule Take 10 mg by mouth before meals and bed Indications: Irritable Bowel Syndrome      ? DULoxetine (CYMBALTA) 60 MG capsule delayed-release particles Take 60 mg by mouth 2 (two) times a day Indications: other      ? EPINEPHrine (EPIPEN 2-PAK) 0.3 MG/0.3ML solution auto-injector Inject 0.3 mL into the shoulder, thigh, or buttocks. As directed     ? eszopiclone (Lunesta) 2 MG tablet Lunesta 2 mg tablet     ? fluticasone furoate-vilanterol (BREO ELLIPTA) 100-25 MCG/INH inhaler Inhale 1 puff daily. Indications: Chronic Obstructive Lung Disease     ? furosemide (LASIX) 20 MG tablet Take 2 tablets  (40 mg total) by mouth 2 (two) times a day Indications: Edema, High Blood Pressure Disorder     ? Lidocaine, Anorectal, 5 % cream Apply 1 application topically 3 (three) times a day 45 g 11   ? metoprolol succinate XL (TOPROL-XL) 100 MG 24 hr tablet Take 100 mg by mouth daily. Indications: High Blood Pressure Disorder     ? mirtazapine (REMERON) 30 MG tablet      ? oxyCODONE-acetaminophen (PERCOCET) 7.5-325 MG per tablet      ? potassium chloride (MICRO-K) 10 MEQ CR capsule      ? promethazine (PHENERGAN) 25 MG tablet promethazine 25 mg tablet     ? rizatriptan (MAXALT) 10 MG tablet rizatriptan 10 mg tablet     ? spironolactone (ALDACTONE) 25 MG tablet spironolactone 25 mg tablet     ? temazepam (RESTORIL) 15 MG capsule temazepam 15 mg capsule     ? tiZANidine (ZANAFLEX) 2 MG tablet tizanidine 2 mg tablet     ? topiramate (TOPAMAX) 25 MG tablet topiramate 25 mg tablet     ? torsemide (DEMADEX) 10 MG tablet      ? traMADol (ULTRAM) 50 MG tablet tramadol 50 mg tablet     ? traZODone (DESYREL) 100 MG tablet trazodone 100 mg tablet     ? triamcinolone (KENALOG) 0.1 % cream Apply 1 application. topically 2 (two) times a day Indications: Allergic Contact Dermatitis      ? valACYclovir (VALTREX) 500 MG tablet valacyclovir 500 mg tablet     ? warfarin (COUMADIN) 5 MG tablet warfarin 5 mg tablet       No current facility-administered medications for this visit.     Family History   Problem Relation Age of Onset   ? Breast cancer Sister    ? Coronary artery disease Mother    ? Diabetes Mother    ? Hypertension Mother    ? Lung cancer Mother    ? Migraines Mother    ? Ovarian cancer Mother    ? Diabetes Brother    ? Diabetes Sister    ? COPD Sister    ? Breast cancer Sister    ? Cancer Father      Past Surgical History:   Procedure Laterality Date   ? CHOLECYSTECTOMY     ? HYSTERECTOMY     ? REPLACEMENT TOTAL KNEE  Right   ? SHOULDER SURGERY      Right     REVIEW OF SYSTEMS  General:  Negative for fevers, chills, fatigue,  malaise, weight loss.  Musculoskeletal: Denies joint pain, has muscle weakness.   Skin:  Negative for itching, dryness.   Neurologic:  Has no weakness.    Heme/Lymphatic:  Negative for abnormal bruising, bleeding that is visible.    Assessment/Plan     Problem List Items Addressed This Visit       Endocrine/Metabolic    Diabetes mellitus (CMS/HCC)   Other Visit Diagnoses     Xerosis of skin    -  Primary       Assessment:  1.  Dry peeling skin   2.  L85.3 -dry skin/Xerosis, mild  3.  Seasonal Dry Skin  4.  Diabetes mellitus    Plan:  Examined and evaluated the patient.  Much time was spent with the patient examining their feet and lower extremities looking for complications not already known by the patient or family, none of which was noted.  Moisturizing creams can be used by the patient on a p.r.n. basis.   Eucerin and Gold Bond products are highly recommended, but any moisturizer used on a p.r.n. basis would be appropriate.  Next, foot and lower extremity screening exam was performed this date long with any preventative palliative care needed.   Approximately 20 minutes was spent with this patient this date including chart review.  The patient may return on a p.r.n. basis.    RTC:  As needed  Electronically signed by Wynelle Beckmann, DPM at 01/09/2022 12:29 PM EDT

## 2022-03-11 ENCOUNTER — Inpatient Hospital Stay
Admit: 2022-03-11 | Discharge: 2022-03-12 | Disposition: A | Discharge status: Home / Self Care | Payer: MEDICARE | Attending: Emergency Medicine

## 2022-03-11 ENCOUNTER — Emergency Department: Admit: 2022-03-11 | Payer: MEDICARE | Primary: Family

## 2022-03-11 DIAGNOSIS — S0990XA Unspecified injury of head, initial encounter: Secondary | ICD-10-CM

## 2022-03-11 DIAGNOSIS — N289 Disorder of kidney and ureter, unspecified: Secondary | ICD-10-CM

## 2022-03-11 LAB — COMPREHENSIVE METABOLIC PANEL
ALT: 14 U/L (ref 10–49)
AST: 33 U/L (ref 0.0–33.9)
Albumin: 3.4 gm/dl (ref 3.4–5.0)
Alkaline Phosphatase: 119 U/L — ABNORMAL HIGH (ref 46–116)
Anion Gap: 7 mmol/L (ref 5–15)
BUN: 79 mg/dl — ABNORMAL HIGH (ref 9–23)
CO2: 25 mEq/L (ref 20–31)
Calcium: 8.8 mg/dl (ref 8.7–10.4)
Chloride: 106 mEq/L (ref 98–107)
Creatinine: 4.36 mg/dl — ABNORMAL HIGH (ref 0.55–1.02)
GFR African American: 13
GFR Non-African American: 11
Glucose: 98 mg/dl (ref 74–106)
Potassium: 6.1 mEq/L (ref 3.5–5.1)
Sodium: 138 mEq/L (ref 136–145)
Total Bilirubin: 0.3 mg/dl (ref 0.30–1.20)
Total Protein: 6.7 gm/dl (ref 5.7–8.2)

## 2022-03-11 LAB — CBC WITH AUTO DIFFERENTIAL
Basophils: 0.5 % (ref 0–3)
Eosinophils: 2.2 % (ref 0–5)
Hematocrit: 45 % (ref 35.0–47.0)
Hemoglobin: 14.7 gm/dl (ref 11.0–16.0)
Immature Granulocytes: 0.3 % (ref 0.0–3.0)
Lymphocytes: 27.7 % — ABNORMAL LOW (ref 28–48)
MCH: 33.2 pg (ref 25.4–34.6)
MCHC: 32.7 gm/dl (ref 30.0–36.0)
MCV: 101.6 fL — ABNORMAL HIGH (ref 80.0–98.0)
MPV: 11.3 fL — ABNORMAL HIGH (ref 6.0–10.0)
Monocytes: 11.3 % (ref 1–13)
Neutrophils Segmented: 58 % (ref 34–64)
Nucleated RBCs: 0 (ref 0–0)
Platelets: 144 10*3/uL (ref 140–450)
RBC: 4.43 M/uL (ref 3.60–5.20)
RDW: 54.2 — ABNORMAL HIGH (ref 36.4–46.3)
WBC: 5.8 10*3/uL (ref 4.0–11.0)

## 2022-03-11 LAB — EKG 12-LEAD
Atrial Rate: 77 {beats}/min
Calculated P Axis: 65 degrees
Calculated R Axis: 70 degrees
Calculated T Axis: -30 degrees
DIAGNOSIS, 93000: NORMAL
P-R Interval: 208 ms
Q-T Interval: 396 ms
QRS Duration: 92 ms
QTC Calculation (Bezet): 448 ms
Ventricular Rate: 77 {beats}/min

## 2022-03-11 LAB — POTASSIUM (POC)
Potassium: 4.8 mEq/L (ref 3.5–4.9)
Potassium: <2 mEq/L — CL (ref 3.5–4.9)

## 2022-03-11 MED ORDER — ACETAMINOPHEN 10 MG/ML IV SOLN
10 MG/ML | INTRAVENOUS | Status: AC
Start: 2022-03-11 — End: 2022-03-11
  Administered 2022-03-11: 21:00:00 1000 mg via INTRAVENOUS

## 2022-03-11 MED ORDER — SODIUM CHLORIDE 0.9 % IV BOLUS
0.9 % | Freq: Once | INTRAVENOUS | Status: AC
Start: 2022-03-11 — End: 2022-03-11
  Administered 2022-03-11: 23:00:00 500 mL via INTRAVENOUS

## 2022-03-11 MED ORDER — SODIUM CHLORIDE 0.9 % IV BOLUS
0.9 % | Freq: Once | INTRAVENOUS | Status: AC
Start: 2022-03-11 — End: 2022-03-11
  Administered 2022-03-11: 21:00:00 500 mL via INTRAVENOUS

## 2022-03-11 MED FILL — ACETAMINOPHEN 10 MG/ML IV SOLN: 10 MG/ML | INTRAVENOUS | Qty: 100

## 2022-03-11 NOTE — ED Notes (Signed)
Report from [place], Therapist, sports.     Rounding complete; pending discharge.      Leeroy Cha, RN  03/11/22 1952

## 2022-03-11 NOTE — ED Notes (Signed)
Pt is off the floor at this time.     Brynda Peon, RN  03/11/22 [phone]

## 2022-03-11 NOTE — ED Notes (Signed)
Informed Ms. Lindsey Yang that pt was ready for discharge; pt made aware that sister is en route.     No further needs at this time.      Leeroy Cha, RN  03/11/22 2009

## 2022-03-11 NOTE — ED Triage Notes (Signed)
Pt arrives via walker through triage c/o fall/hitting her head. Pt fell 2 days ago. Pt report sfeeling dizzy    Pt's bp in triage 77/59, 81/61

## 2022-03-11 NOTE — Discharge Instructions (Addendum)
Your blood pressure was lower today than it should be related to dehydration.  You need to increase fluid and food intake.  Your kidney function is also a little worse than usual related to not eating and drinking enough.    Hold your blood pressure medications tomorrow 11/2 and call your doctor for follow-up of your lab values and blood pressure and discuss any changes to your blood pressure medications based on your symptoms of dizziness when standing.    You should follow-up with a kidney specialist.    Call for an appointment to be seen in the next week to have your kidney function rechecked.    Return here if you have fever, vomiting, persistent dizziness, other concerns.    Vitals:    03/11/22 1408 03/11/22 1802 03/11/22 1848   BP: (!) 81/61 90/61 103/62   Pulse: 85 68    Resp: 16     Temp: 97.6 F (36.4 C)     TempSrc: Oral     SpO2: 95% 97%    Weight: 60.3 kg (133 lb)     Height: 1.524 m (5')       Results for orders placed or performed during the hospital encounter of 03/11/22   CT Head W/O Contrast    Narrative    All CT exams at this facility use one or more dose reduction techniques  including automatic exposure control, mA/kV adjustment per patient's size, or  iterative reconstruction technique.    Indication: Closed head injury with pain  Technique:  5 millimeter axial images without contrast were obtained through the  brain.    Comparison: none    Findings:   There is generalized cerebral atrophy.  Low attenuation surrounds  the lateral ventricles.  No hemorrhage, mass, or infarct. Sella, pineal,  craniocervical junction, orbits, paranasal sinuses, and temporal bones are  unremarkable.  Bony structures intact.      Impression    Impression: cerebral atrophy and nonspecific white matter changes not unusual  for age; no acute abnormality.    Electronically signed by: Darlina Rumpf, MD 03/11/2022 5:38 PM EDT            Workstation ID: [id number]     CT CSpine W/O Contrast    Narrative    All CT exams at  this facility use one or more dose reduction techniques  including automatic exposure control, mA/kV adjustment per patient's size, or  iterative reconstruction technique.    CT cervical spine:      Indication: Injury with pain    TECHNIQUE: 3 mm axial images obtained through cervical spine without contrast  and reconstructed in sagittal and coronal planes.    FINDINGS: There is no fracture. Osteophytes at vertebral body margins throughout  the spine. 2 mm anterior subluxation C3 on C4. Disc spaces normal in height.  Prevertebral soft tissues normal. Lung apices clear.       Impression    IMPRESSION: 2 mm anterior subluxation C3 on C4. Degenerative changes as  described. No acute abnormality otherwise seen.    Electronically signed by: Darlina Rumpf, MD 03/11/2022 5:40 PM EDT            Workstation ID: [id number]     XR CHEST PORTABLE    Narrative    Indication: Fall with pain  Frontal view chest     The heart is normal in size. Lungs are clear and the bony thorax shows metallic  hardware right humeral head. No effusions or other abnormalities are  seen.      Impression    Impression: No acute abnormality. Electronically signed by: Debby Bud, MD  03/11/2022 5:02 PM EDT           Workstation ID: [id number]     CBC with Auto Differential   Result Value Ref Range    WBC 5.8 4.0 - 11.0 1000/mm3    RBC 4.43 3.60 - 5.20 M/uL    Hemoglobin 14.7 11.0 - 16.0 gm/dl    Hematocrit 45.0 35.0 - 47.0 %    MCV 101.6 (H) 80.0 - 98.0 fL    MCH 33.2 25.4 - 34.6 pg    MCHC 32.7 30.0 - 36.0 gm/dl    Platelets 144 140 - 450 1000/mm3    MPV 11.3 (H) 6.0 - 10.0 fL    RDW 54.2 (H) 36.4 - 46.3      Nucleated RBCs 0 0 - 0      Immature Granulocytes 0.3 0.0 - 3.0 %    Neutrophils Segmented 58.0 34 - 64 %    Lymphocytes 27.7 (L) 28 - 48 %    Monocytes 11.3 1 - 13 %    Eosinophils 2.2 0 - 5 %    Basophils 0.5 0 - 3 %   Comprehensive Metabolic Panel   Result Value Ref Range    Potassium 6.1 (HH) 3.5 - 5.1 mEq/L    Chloride 106 98 - 107 mEq/L     Sodium 138 136 - 145 mEq/L    CO2 25 20 - 31 mEq/L    Glucose 98 74 - 106 mg/dl    BUN 79 (H) 9 - 23 mg/dl    Creatinine 4.36 (H) 0.55 - 1.02 mg/dl    GFR African American 13.0      GFR Non-African American 11      Calcium 8.8 8.7 - 10.4 mg/dl    Anion Gap 7 5 - 15 mmol/L    AST 33.0 0.0 - 33.9 U/L    ALT 14 10 - 49 U/L    Alkaline Phosphatase 119 (H) 46 - 116 U/L    Total Bilirubin 0.30 0.30 - 1.20 mg/dl    Total Protein 6.7 5.7 - 8.2 gm/dl    Albumin 3.4 3.4 - 5.0 gm/dl    Narrative    * ATTENTION *  THE FOLLOWING TEST HAS BEEN DETERMINED TO HAVE A CRITICAL VALUE.  THE  _k  RESULT WAS CALLED TO (AND READ BACK BY) [email or web address].        THE NOTIFICATION WAS MADE ON _11/1/23   BY _220254   POTASSIUM (POC)   Result Value Ref Range    Potassium <2.0 (LL) 3.5 - 4.9 mEq/L   POTASSIUM (POC)   Result Value Ref Range    Potassium 4.8 3.5 - 4.9 mEq/L   EKG 12 Lead   Result Value Ref Range    Ventricular Rate 77 BPM    Atrial Rate 77 BPM    P-R Interval 208 ms    QRS Duration 92 ms    Q-T Interval 396 ms    QTC Calculation (Bezet) 448 ms    Calculated P Axis 65 degrees    Calculated R Axis 70 degrees    Calculated T Axis -30 degrees    DIAGNOSIS, 93000       Normal sinus rhythm  Low voltage QRS  Inferior infarct , age undetermined  ST & T wave abnormality, consider anterior ischemia  Abnormal ECG  No previous ECGs available  Confirmed  by Allena Katz, M.D., Anil [phone]) on 03/11/2022 3:53:55 PM

## 2022-03-11 NOTE — ED Notes (Signed)
Patient's sister Linus Orn will come pick her up when ready for discharge.  Call [phone] or [phone].       Jenelle Mages, PA-C  03/11/22 1900

## 2022-03-11 NOTE — ED Notes (Signed)
Pt brought back to rm 34 by PA Presley Raddle, Vergene Marland R, RN  34/28/76 1639

## 2022-03-11 NOTE — ED Provider Notes (Cosign Needed)
[facility]  Emergency [facility] Treatment Report        Patient: Lindsey McalpineCynthia E Yang Age: 71 y.o. Sex: female    Date of Birth: 22-Jul-1950 Admit Date: 03/11/2022 PCP: Delene RuffiniFussell, Lucas Allen, APRN - NP   MRN: [id number]  CSN: [id number]     Room: ER34/ER34 Time Dictated: 6:56 PM      Attending MD:  Christiana PellantFickenscher, Ben A, MD   APC:  Marlana SalvageErin Kendal Ghazarian PA-C    Chief Complaint   Dizziness, fall, head injury     History of Present Illness   71 y.o. female with a history of chronic pain, chronic kidney disease, COPD, pulmonary hypertension, GERD, DVT no longer on anticoagulation presents to the emergency department for evaluation of a closed head injury 2 days ago.  Patient states she was asleep and fell out of bed, she has been having some headache from hitting her head on the right side, some neck pain, some soreness in her right chest when she turns.  She does not feel short of breath or hurt when she takes a deep breath, she has a chronic cough that is unchanged.  Denies fevers or chills.  Her p.o. intake has been decreased over the past week, she has had little to eat or drink, she has noticed some dizziness when she stands.  She has not had syncope or near syncope.  Denies chest pain or palpitations.  Has been urinating normally and states has been clear without pain.  Denies flank pain.    States she called her doctor to let them know she was still having some mild right-sided headache and some intermittent dizziness after the head injury.  She is here today because of her grandson having surgery and he advised while she was waiting for him to get out of surgery she come to the emergency department to be seen.    Review of Systems   Review of Systems  See HPI     Past Medical/Surgical History     Past Medical History:   Diagnosis Date    Abnormal urine finding     Cigarette nicotine dependence     CKD (chronic kidney disease), stage III (HCC)     Colon polyp     COPD (chronic obstructive pulmonary disease) (HCC)      Fall 05/2018    GERD (gastroesophageal reflux disease)     Gout     Hyperglycemia     Mixed hyperlipidemia     Right renal mass     Vitamin D deficiency      Past Surgical History:   Procedure Laterality Date    CATARACT REMOVAL      CHOLECYSTECTOMY      COLONOSCOPY      HYSTERECTOMY      OVARIAN CYST REMOVAL      TONSILLECTOMY       Social History     Social History     Socioeconomic History    Marital status: Widowed   Tobacco Use    Smoking status: Former    Smokeless tobacco: Never   Substance and Sexual Activity    Alcohol use: Not Currently    Drug use: Never     Family History     Family History   Problem Relation Age of Onset    Breast Cancer Sister     Diabetes Sister     Breast Cancer Sister     Diabetes Sister     Other Sister  Gastric ulcer     Current Medications     Previous Medications    ALBUTEROL SULFATE HFA (PROVENTIL;VENTOLIN;PROAIR) 108 (90 BASE) MCG/ACT INHALER    ProAir HFA 90 mcg/actuation aerosol inhaler    ALLOPURINOL (ZYLOPRIM) 300 MG TABLET    allopurinol 300 mg tablet    AMLODIPINE (NORVASC) 10 MG TABLET    amlodipine 10 mg tablet    CETIRIZINE (ZYRTEC) 10 MG TABLET    TAKE ONE TABLET EVERY DAY    CLONAZEPAM (KLONOPIN) 1 MG TABLET    Patient takes 2 tablets at bedtime    DICYCLOMINE (BENTYL) 20 MG TABLET    Take 1 tablet (20 mg total) by mouth 2 (two) times a day.    DULOXETINE (CYMBALTA) 60 MG EXTENDED RELEASE CAPSULE    duloxetine 60 mg capsule,delayed release    GABAPENTIN (NEURONTIN) 100 MG CAPSULE    Take 200 mg by mouth 4 times daily.    METOPROLOL SUCCINATE (TOPROL XL) 100 MG EXTENDED RELEASE TABLET    metoprolol succinate ER 100 mg tablet,extended release 24 hr    NALOXONE 4 MG/0.1ML LIQD NASAL SPRAY    Narcan 4 mg/actuation nasal spray    OXYCODONE-ACETAMINOPHEN (PERCOCET) 10-325 MG PER TABLET    oxycodone-acetaminophen 10 mg-325 mg tablet   Take 1 tablet 3 times a day by oral route.    POTASSIUM CHLORIDE (KLOR-CON) 10 MEQ EXTENDED RELEASE TABLET    potassium chloride  ER 10 mEq tablet,extended release    PRAVASTATIN (PRAVACHOL) 20 MG TABLET    pravastatin 20 mg tablet   I reviewed her records and she was previously on Eliquis but she states she is no longer on anticoagulation    Allergies   Iodinated contrast media, Phenobarbital, Shellfish allergy, Glucosamine, Ibuprofen, Lamotrigine, Penicillins, Pregabalin, Sulfamethoxazole-trimethoprim, and Ace inhibitors    Physical Exam     ED Triage Vitals [03/11/22 1408]   BP Temp Temp Source Pulse Respirations SpO2 Height Weight - Scale   (!) 81/61 97.6 F (36.4 C) Oral 85 16 95 % 1.524 m (5') 60.3 kg (133 lb)     Physical Exam  Vitals and nursing note reviewed.   Constitutional:       Appearance: She is not ill-appearing, toxic-appearing or diaphoretic.   HENT:      Head: Normocephalic and atraumatic. No raccoon eyes, Battle's sign or laceration.      Comments: Small right sided hematoma     Nose: Nose normal.      Mouth/Throat:      Pharynx: Oropharynx is clear.   Eyes:      General: No scleral icterus.     Extraocular Movements: Extraocular movements intact.      Conjunctiva/sclera: Conjunctivae normal.      Pupils: Pupils are equal, round, and reactive to light.   Cardiovascular:      Rate and Rhythm: Normal rate and regular rhythm.      Pulses: Normal pulses.   Pulmonary:      Effort: Pulmonary effort is normal.      Breath sounds: Normal breath sounds.   Abdominal:      General: Abdomen is flat.      Palpations: Abdomen is soft.      Tenderness: There is no abdominal tenderness. There is no guarding or rebound.   Musculoskeletal:         General: Normal range of motion.      Cervical back: Normal range of motion and neck supple.      Right lower  leg: No edema.      Left lower leg: No edema.      Comments: Patient has mild C-spine tenderness, no T-spine or LS-spine tenderness.  No pain in her upper or lower extremities.  She ambulates with me from the waiting room back to her room with a normal gait.   Skin:     Comments: Small  hematoma right forehead   Neurological:      General: No focal deficit present.      Mental Status: She is alert and oriented to person, place, and time.      Sensory: No sensory deficit.      Motor: No weakness.      Gait: Gait normal.   Psychiatric:         Mood and Affect: Mood normal.       Impression and Management Plan     Patient presenting after a fall out of bed 2 nights ago.  She states this is the primary reason she is here, she said she fell out of bed and hit her head, she is been having some intermittent headaches since then as well as some neck pain.  She has some pain in her right side of her ribs.  She has a small hematoma on her head, previously on anticoagulation but no longer per patient.  We will evaluate by CT imaging to rule out intracranial hemorrhage or C-spine fracture, chest x-ray to rule out rib injury although patient denies any acute dyspnea, lung sounds are clear to auscultation and she is not hypoxic on RA.  She is mildly hypotensive on presentation, she tells me she has not been eating and drinking well and when she stands she is mildly dizzy.  This is an acute on chronic problem.    She ambulated from the waiting room without any dizziness or near syncope.  We will reassess her blood pressure, I think she is dehydrated/hypovolemic as she has not been eating and drinking well which is wildly why she is hypotensive.  She does not have any infectious symptoms that would make me concerned for developing sepsis.  Will evaluate for any acute anemia, worsening renal function, electrolyte disturbance.    Diagnostic Studies     Lab:   Recent Results (from the past 12 hour(s))   EKG 12 Lead    Collection Time: 03/11/22  3:03 PM   Result Value Ref Range    Ventricular Rate 77 BPM    Atrial Rate 77 BPM    P-R Interval 208 ms    QRS Duration 92 ms    Q-T Interval 396 ms    QTC Calculation (Bezet) 448 ms    Calculated P Axis 65 degrees    Calculated R Axis 70 degrees    Calculated T Axis -30  degrees    DIAGNOSIS, 93000       Normal sinus rhythm  Low voltage QRS  Inferior infarct , age undetermined  ST & T wave abnormality, consider anterior ischemia  Abnormal ECG  No previous ECGs available  Confirmed by Allena Katz, M.D., Anil ([id number]) on 03/11/2022 3:53:55 PM     CBC with Auto Differential    Collection Time: 03/11/22  3:10 PM   Result Value Ref Range    WBC 5.8 4.0 - 11.0 1000/mm3    RBC 4.43 3.60 - 5.20 M/uL    Hemoglobin 14.7 11.0 - 16.0 gm/dl    Hematocrit 94.7 65.4 - 47.0 %    MCV 101.6 (H)  80.0 - 98.0 fL    MCH 33.2 25.4 - 34.6 pg    MCHC 32.7 30.0 - 36.0 gm/dl    Platelets 144 140 - 450 1000/mm3    MPV 11.3 (H) 6.0 - 10.0 fL    RDW 54.2 (H) 36.4 - 46.3      Nucleated RBCs 0 0 - 0      Immature Granulocytes 0.3 0.0 - 3.0 %    Neutrophils Segmented 58.0 34 - 64 %    Lymphocytes 27.7 (L) 28 - 48 %    Monocytes 11.3 1 - 13 %    Eosinophils 2.2 0 - 5 %    Basophils 0.5 0 - 3 %   Comprehensive Metabolic Panel    Collection Time: 03/11/22  3:10 PM   Result Value Ref Range    Potassium 6.1 (HH) 3.5 - 5.1 mEq/L    Chloride 106 98 - 107 mEq/L    Sodium 138 136 - 145 mEq/L    CO2 25 20 - 31 mEq/L    Glucose 98 74 - 106 mg/dl    BUN 79 (H) 9 - 23 mg/dl    Creatinine 4.36 (H) 0.55 - 1.02 mg/dl    GFR African American 13.0      GFR Non-African American 11      Calcium 8.8 8.7 - 10.4 mg/dl    Anion Gap 7 5 - 15 mmol/L    AST 33.0 0.0 - 33.9 U/L    ALT 14 10 - 49 U/L    Alkaline Phosphatase 119 (H) 46 - 116 U/L    Total Bilirubin 0.30 0.30 - 1.20 mg/dl    Total Protein 6.7 5.7 - 8.2 gm/dl    Albumin 3.4 3.4 - 5.0 gm/dl   POTASSIUM (POC)    Collection Time: 03/11/22  6:16 PM   Result Value Ref Range    Potassium <2.0 (LL) 3.5 - 4.9 mEq/L   POTASSIUM (POC)    Collection Time: 03/11/22  6:24 PM   Result Value Ref Range    Potassium 4.8 3.5 - 4.9 mEq/L     Labs Reviewed   CBC WITH AUTO DIFFERENTIAL - Abnormal; Notable for the following components:       Result Value    MCV 101.6 (*)     MPV 11.3 (*)     RDW 54.2 (*)      Lymphocytes 27.7 (*)     All other components within normal limits   COMPREHENSIVE METABOLIC PANEL - Abnormal; Notable for the following components:    Potassium 6.1 (*)     BUN 79 (*)     Creatinine 4.36 (*)     Alkaline Phosphatase 119 (*)     All other components within normal limits    Narrative:     * ATTENTION *  THE FOLLOWING TEST HAS BEEN DETERMINED TO HAVE A CRITICAL VALUE.  THE  _k  RESULT WAS CALLED TO (AND READ BACK BY) [email or web address].        THE NOTIFICATION WAS MADE ON _11/1/23   BY _161096   POTASSIUM (POC) - Abnormal; Notable for the following components:    Potassium <2.0 (*)     All other components within normal limits   POTASSIUM (POC)   POCT POTASSIUM     EKG reviewed with Dr. Dennison Bulla , my independent interpretation = sinus rhythm rate 77 , no ST-T wave changes to suggest acute ischemia.    Imaging:    CT Head W/O Contrast  Final Result   Impression: cerebral atrophy and nonspecific white matter changes not unusual   for age; no acute abnormality.      Electronically signed by: Darlina Rumpf, MD 03/11/2022 5:38 PM EDT             Workstation ID: [id number]         CT CSpine W/O Contrast   Final Result   IMPRESSION: 2 mm anterior subluxation C3 on C4. Degenerative changes as   described. No acute abnormality otherwise seen.      Electronically signed by: Darlina Rumpf, MD 03/11/2022 5:40 PM EDT             Workstation ID: [id number]         XR CHEST PORTABLE   Final Result   Impression: No acute abnormality. Electronically signed by: Darlina Rumpf, MD   03/11/2022 5:02 PM EDT           Workstation ID: [id number]           ED Course/Medical Decision Making     Patient was maintained on cardiac monitoring.  Indication for monitoring: hypotension, dizziness, CHI, ? hyperkalemia      Vitals:    03/11/22 1408 03/11/22 1802 03/11/22 1848   BP: (!) 81/61 90/61 103/62   Pulse: 85 68    Resp: 16     Temp: 97.6 F (36.4 C)     TempSrc: Oral     SpO2: 95% 97%    Weight: 60.3 kg (133 lb)     Height: 1.524  m (5')         ED Course as of 03/11/22 1856   Wed Mar 11, 2022   1633 I reviewed external records.  Patient had creatinine of 4.3 and BUN of 67 in July 2023 [EI]   1745 Patient reassessed, she is watching TV, admits feeling some improvement with IV acetaminophen.  Blood pressure remains low, IV fluids just started.  Updated on test results including negative CT imaging. [EI]   [id number] Patient reassessed after 500 cc bolus, she feels a little better, she has been up in the room without dizziness.  BP now 98/61.  Additional 500 cc fluid boluses ordered.  Point-of-care potassium recheck is 4.8 therefore I think her initial was likely hemolyzed specimen. [EI]   [id number] Patient reassessed.  Current BP is 103/62.  Second 500 cc fluid bolus has hung.  Patient is feeling a lot better.  Discussed with her that she should hold her blood pressure pills tomorrow, call her doctor to discuss any decrease in dosages, increase her fluid and food intake, discuss if she needs to see a nephrologist as an outpatient given her worsening renal disease.    Gust with Dr. Leretha Pol, patient will be discharged after second 500 cc fluid bolus. [EI]      ED Course User Index  [EI] Julien Girt, PA-C       Medications   sodium chloride 0.9 % bolus 500 mL (500 mLs IntraVENous New Bag 03/11/22 1844)   sodium chloride 0.9 % bolus 500 mL (0 mLs IntraVENous Stopped 03/11/22 1816)   acetaminophen (OFIRMEV) infusion 1,000 mg (0 mg IntraVENous Stopped 03/11/22 1742)     MDM NARRATIVE:  Patient presenting to the emergency department for evaluation of a fall out of bed 2 days ago.  She states her primary reason she is here is because when she fell out of bed she hit her head.  She did not have a syncopal episode  or get out of bed and fall she fell directly out of bed to the floor.  She has a mild hematoma on the right side of her head, she has some neck pain.  Her CT imaging reveals no skull fracture, no intracranial hemorrhage, no C-spine fracture.  She was  found to have hypotension at triage.  She states she is not eating and drinking well, this is an acute on chronic issue.  She has chronic renal insufficiency stage III at baseline, today has increasing creatinine and BUN likely related to dehydration versus possible mixed progression of her chronic renal disease.  She does not follow with nephrology and I think she should.  Her potassium on triage labs was 6.1 however she does not have any EKG changes suggestive of hyperkalemia.  I think this was hemolyzed.  We repeated her potassium from a fresh stick and it was 4.8.  After several fluid boluses her blood pressures improved to 103/62.  When she was at [facility] in June 2023 she had a blood pressure of 109/60 so this is close to her baseline.  As in impression and management plan she has no infectious symptoms so I do not think this hypotension is representative of developing sepsis.  She feels much better after IV hydration, I discussed at length her work-up and test results with her and her sister.  They both feel comfortable with discharge home and follow-up closely with PCP.    I advised her to hold her blood pressure medicine tomorrow and call her PCP to discuss any dosage adjustments given her low blood pressures here.  Of encouraged her to eat and drink more at home.  Have encouraged her to see a nephrologist as an outpatient.    My independent interpretation of the patient's diagnostic testing is as follows:  CXR: My independent interpretation is no acute process  EKG: My independent interpretation is sinus rhythm rate 77 no acute ST changes to suggest ischemia or hyperkalemia  CT head: My independent interpretation is no large bleed or skull fracture  CT C-spine: My independent interpretation is no fracture  Cardiac monitoring: Independent interpretation is sinus rhythm rate 68 no ectopy    INDEPENDENT HISTORIAN:  History and/or plan development assisted by: patient's sister at bedside    EXTERNAL RESULTS  REVIEWED:  I reviewed the patient's previous records here at [facility] and available outside facilities via Care Everywhere:   See above for labs at [facility] July 2023 showing chronic kidney disease  Vital signs at [facility] in July 2023 blood pressure was 109/75    Severe exacerbation or progression of chronic illness: Chronic kidney disease    Comorbidities impacting Evaluation and Management: CKD stage III, diabetes, chronic pain syndrome on oral narcotics, on antihypertensive medications    SOCIAL DETERMINANTS  impacting Evaluation and Management: patient called her PCP and advised unable to see in office and to come to ED    Threat to body function without evaluation and management:  neurologic, vascular, renal    Final Diagnosis     1. Acute on chronic renal insufficiency    2. Dehydration    3. Transient hypotension    4. Closed head injury, initial encounter    5. Strain of neck muscle, initial encounter    6. History of chronic pain      Disposition   Patient is discharged home in stable condition, with instructions to follow up with their regular doctor. They are advised to return immediately for any worsening  or symptoms of concern.    Follow-up information:     Delene Ruffini, APRN - NP  [address]  [place] [address]  [phone]    Schedule an appointment as soon as possible for a visit   for follow-up of today's visit- dehydration, worsening kidney function    Vernard Gambles, MD  [address] [facility] [place] [address]  [phone]    Schedule an appointment as soon as possible for a visit   for follow-up of today's visit- abnormal kidney labs    [facility] EMERGENCY DEPT  [address] [place] [address]  [phone]  Go to   If symptoms worsen      New Prescriptions    No medications on file       The patient was discussed with Christiana Pellant, MD who agrees with the above assessment and plan.    Chrishawn Kring E. Ronnie Derby  March 11, 2022    My signature above  authenticates this document and my orders, the final    diagnosis (es), discharge prescription (s), and instructions in the Epic    record.  If you have any questions please contact [phone].     Nursing notes have been reviewed by the physician/ advanced practice    Clinician.    Dragon medical dictation software was used for portions of this report. Unintended voice recognition errors may occur.                   Julien Girt, PA-C  03/11/22 1859

## 2023-11-09 DEATH — deceased
